# Patient Record
Sex: Male | Born: 1965 | Hispanic: No | Smoking: Former smoker
Health system: Southern US, Community
[De-identification: ages and names within clinical notes are randomized; demographics above are authoritative.]

## PROBLEM LIST (undated history)

## (undated) DIAGNOSIS — E785 Hyperlipidemia, unspecified: Secondary | ICD-10-CM

## (undated) DIAGNOSIS — F419 Anxiety disorder, unspecified: Secondary | ICD-10-CM

## (undated) DIAGNOSIS — G4700 Insomnia, unspecified: Secondary | ICD-10-CM

## (undated) DIAGNOSIS — F329 Major depressive disorder, single episode, unspecified: Secondary | ICD-10-CM

## (undated) DIAGNOSIS — F32A Depression, unspecified: Secondary | ICD-10-CM

## (undated) DIAGNOSIS — T7840XA Allergy, unspecified, initial encounter: Secondary | ICD-10-CM

## (undated) HISTORY — DX: Major depressive disorder, single episode, unspecified: F32.9

## (undated) HISTORY — DX: Hyperlipidemia, unspecified: E78.5

## (undated) HISTORY — DX: Insomnia, unspecified: G47.00

## (undated) HISTORY — DX: Anxiety disorder, unspecified: F41.9

## (undated) HISTORY — DX: Allergy, unspecified, initial encounter: T78.40XA

## (undated) HISTORY — PX: APPENDECTOMY: SHX54

## (undated) HISTORY — DX: Depression, unspecified: F32.A

---
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
## 2003-11-15 ENCOUNTER — Emergency Department (HOSPITAL_COMMUNITY): Admission: EM | Admit: 2003-11-15 | Discharge: 2003-11-15 | Payer: Self-pay | Admitting: Emergency Medicine

## 2004-02-20 ENCOUNTER — Ambulatory Visit: Payer: Self-pay | Admitting: Internal Medicine

## 2004-03-23 ENCOUNTER — Ambulatory Visit: Payer: Self-pay | Admitting: Internal Medicine

## 2005-02-05 ENCOUNTER — Ambulatory Visit: Payer: Self-pay | Admitting: Internal Medicine

## 2005-02-19 ENCOUNTER — Ambulatory Visit: Payer: Self-pay | Admitting: Internal Medicine

## 2005-03-22 ENCOUNTER — Ambulatory Visit: Payer: Self-pay | Admitting: Internal Medicine

## 2005-10-03 ENCOUNTER — Ambulatory Visit: Payer: Self-pay | Admitting: Internal Medicine

## 2005-11-01 ENCOUNTER — Ambulatory Visit: Payer: Self-pay | Admitting: Internal Medicine

## 2005-12-17 ENCOUNTER — Ambulatory Visit: Payer: Self-pay | Admitting: Internal Medicine

## 2006-04-15 ENCOUNTER — Ambulatory Visit: Payer: Self-pay | Admitting: Internal Medicine

## 2006-04-15 LAB — CONVERTED CEMR LAB
Cholesterol: 311 mg/dL (ref 0–200)
Direct LDL: 260.1 mg/dL
HDL: 39.2 mg/dL (ref >39.0)
Total CHOL/HDL Ratio: 7.9
Triglycerides: 48 mg/dL (ref 0–149)
VLDL: 10 mg/dL (ref 0–40)

## 2006-05-06 ENCOUNTER — Ambulatory Visit: Payer: Self-pay | Admitting: Internal Medicine

## 2006-06-19 ENCOUNTER — Encounter: Payer: Self-pay | Admitting: Internal Medicine

## 2006-06-19 ENCOUNTER — Ambulatory Visit: Payer: Self-pay | Admitting: Internal Medicine

## 2006-06-19 DIAGNOSIS — E785 Hyperlipidemia, unspecified: Secondary | ICD-10-CM | POA: Insufficient documentation

## 2006-06-19 DIAGNOSIS — F341 Dysthymic disorder: Secondary | ICD-10-CM | POA: Insufficient documentation

## 2006-06-19 DIAGNOSIS — G4700 Insomnia, unspecified: Secondary | ICD-10-CM | POA: Insufficient documentation

## 2006-10-06 ENCOUNTER — Telehealth: Payer: Self-pay | Admitting: Internal Medicine

## 2006-10-10 ENCOUNTER — Ambulatory Visit: Payer: Self-pay | Admitting: Internal Medicine

## 2006-10-10 LAB — CONVERTED CEMR LAB

## 2006-10-11 LAB — CONVERTED CEMR LAB
ALT: 18 units/L (ref 0–53)
AST: 18 units/L (ref 0–37)
Cholesterol: 280 mg/dL (ref 0–200)
Direct LDL: 237.4 mg/dL
HDL: 37.6 mg/dL — ABNORMAL LOW (ref >39.0)
TSH: 0.78 microintl units/mL (ref 0.35–5.50)
Total CHOL/HDL Ratio: 7.4
Triglycerides: 69 mg/dL (ref 0–149)
VLDL: 14 mg/dL (ref 0–40)

## 2006-10-13 ENCOUNTER — Encounter (INDEPENDENT_AMBULATORY_CARE_PROVIDER_SITE_OTHER): Payer: Self-pay | Admitting: *Deleted

## 2007-02-06 ENCOUNTER — Ambulatory Visit: Payer: Self-pay | Admitting: Internal Medicine

## 2007-03-16 ENCOUNTER — Telehealth (INDEPENDENT_AMBULATORY_CARE_PROVIDER_SITE_OTHER): Payer: Self-pay | Admitting: *Deleted

## 2007-05-04 ENCOUNTER — Ambulatory Visit: Payer: Self-pay | Admitting: Internal Medicine

## 2007-05-07 ENCOUNTER — Ambulatory Visit: Payer: Self-pay | Admitting: Internal Medicine

## 2007-05-13 ENCOUNTER — Telehealth: Payer: Self-pay | Admitting: Internal Medicine

## 2007-05-13 LAB — CONVERTED CEMR LAB
ALT: 21 units/L (ref 0–53)
AST: 23 units/L (ref 0–37)
Calcium: 9.5 mg/dL (ref 8.4–10.5)
Chloride: 105 meq/L (ref 96–112)
Eosinophils Absolute: 0.1 10*3/uL (ref 0.0–0.6)
GFR calc non Af Amer: 88 mL/min
Glucose, Bld: 98 mg/dL (ref 70–99)
HDL: 41.1 mg/dL (ref >39.0)
Lymphocytes Relative: 25 % (ref 12.0–46.0)
MCV: 87.9 fL (ref 78.0–100.0)
Monocytes Relative: 7.9 % (ref 3.0–11.0)
Neutro Abs: 2.9 10*3/uL (ref 1.4–7.7)
Potassium: 4.2 meq/L (ref 3.5–5.1)
RBC: 4.79 M/uL (ref 4.22–5.81)
RDW: 11.3 % — ABNORMAL LOW (ref 11.5–14.6)
Sodium: 145 meq/L (ref 135–145)
Total CHOL/HDL Ratio: 5.7
Triglycerides: 74 mg/dL (ref 0–149)

## 2007-10-07 ENCOUNTER — Ambulatory Visit: Payer: Self-pay | Admitting: Internal Medicine

## 2008-01-13 ENCOUNTER — Ambulatory Visit: Payer: Self-pay | Admitting: Internal Medicine

## 2008-01-14 LAB — CONVERTED CEMR LAB
ALT: 43 units/L (ref 0–53)
AST: 29 units/L (ref 0–37)
Cholesterol: 208 mg/dL (ref 0–200)
Direct LDL: 154.6 mg/dL
HDL: 27.6 mg/dL — ABNORMAL LOW (ref >39.0)
Total CHOL/HDL Ratio: 7.5
Triglycerides: 85 mg/dL (ref 0–149)
VLDL: 17 mg/dL (ref 0–40)

## 2008-07-19 ENCOUNTER — Telehealth (INDEPENDENT_AMBULATORY_CARE_PROVIDER_SITE_OTHER): Payer: Self-pay | Admitting: *Deleted

## 2008-07-20 ENCOUNTER — Ambulatory Visit: Payer: Self-pay | Admitting: Internal Medicine

## 2008-11-09 ENCOUNTER — Ambulatory Visit: Payer: Self-pay | Admitting: Internal Medicine

## 2008-11-11 ENCOUNTER — Telehealth (INDEPENDENT_AMBULATORY_CARE_PROVIDER_SITE_OTHER): Payer: Self-pay | Admitting: *Deleted

## 2008-11-11 LAB — CONVERTED CEMR LAB
ALT: 30 units/L (ref 0–53)
AST: 27 units/L (ref 0–37)
BUN: 16 mg/dL (ref 6–23)
CO2: 33 meq/L — ABNORMAL HIGH (ref 19–32)
Calcium: 9.3 mg/dL (ref 8.4–10.5)
Chloride: 108 meq/L (ref 96–112)
Cholesterol: 238 mg/dL — ABNORMAL HIGH (ref 0–200)
Creatinine, Ser: 1 mg/dL (ref 0.4–1.5)
Direct LDL: 198.5 mg/dL
GFR calc non Af Amer: 87.53 mL/min (ref >60)
Glucose, Bld: 97 mg/dL (ref 70–99)
HDL: 41.4 mg/dL (ref >39.00)
Hemoglobin: 14.8 g/dL (ref 13.0–17.0)
Potassium: 4.7 meq/L (ref 3.5–5.1)
Sodium: 146 meq/L — ABNORMAL HIGH (ref 135–145)
Total CHOL/HDL Ratio: 6
Triglycerides: 78 mg/dL (ref 0.0–149.0)
VLDL: 15.6 mg/dL (ref 0.0–40.0)

## 2009-01-17 ENCOUNTER — Telehealth (INDEPENDENT_AMBULATORY_CARE_PROVIDER_SITE_OTHER): Payer: Self-pay | Admitting: *Deleted

## 2009-02-01 ENCOUNTER — Telehealth (INDEPENDENT_AMBULATORY_CARE_PROVIDER_SITE_OTHER): Payer: Self-pay | Admitting: *Deleted

## 2009-05-10 ENCOUNTER — Ambulatory Visit: Payer: Self-pay | Admitting: Internal Medicine

## 2009-06-21 ENCOUNTER — Ambulatory Visit: Payer: Self-pay | Admitting: Internal Medicine

## 2009-06-24 LAB — CONVERTED CEMR LAB
ALT: 34 units/L (ref 0–53)
Direct LDL: 219.5 mg/dL
VLDL: 22.2 mg/dL (ref 0.0–40.0)

## 2009-07-17 ENCOUNTER — Telehealth (INDEPENDENT_AMBULATORY_CARE_PROVIDER_SITE_OTHER): Payer: Self-pay | Admitting: *Deleted

## 2009-10-10 ENCOUNTER — Ambulatory Visit: Payer: Self-pay | Admitting: Internal Medicine

## 2009-10-25 LAB — CONVERTED CEMR LAB
BUN: 12 mg/dL (ref 6–23)
Calcium: 9.6 mg/dL (ref 8.4–10.5)
Chloride: 109 meq/L (ref 96–112)
Cholesterol: 226 mg/dL — ABNORMAL HIGH (ref 0–200)
Creatinine, Ser: 0.9 mg/dL (ref 0.4–1.5)
Direct LDL: 164.1 mg/dL
Glucose, Bld: 91 mg/dL (ref 70–99)
Potassium: 4.8 meq/L (ref 3.5–5.1)
Sodium: 143 meq/L (ref 135–145)
Triglycerides: 74 mg/dL (ref 0.0–149.0)
VLDL: 14.8 mg/dL (ref 0.0–40.0)

## 2010-02-06 ENCOUNTER — Telehealth: Payer: Self-pay | Admitting: Internal Medicine

## 2010-04-10 ENCOUNTER — Telehealth: Payer: Self-pay | Admitting: Internal Medicine

## 2010-04-11 ENCOUNTER — Ambulatory Visit
Admission: RE | Admit: 2010-04-11 | Discharge: 2010-04-11 | Payer: Self-pay | Source: Home / Self Care | Attending: Internal Medicine | Admitting: Internal Medicine

## 2010-04-25 NOTE — Assessment & Plan Note (Signed)
Summary: 6 MTH FU/NS/KDC   Vital Signs:  Patient profile:   45 year old male Height:      73 inches Weight:      204.6 pounds BMI:     27.09 Pulse rate:   74 / minute BP sitting:   122 / 80 CC: rov Is Patient Diabetic? No   History of Present Illness: bump in the suprapubic area, likes a "blood test"  high chol-- likes FLP, diet not better  insomnia-- switched from xanax to [place] not working as well anymore  back on xanax?  Current Medications (verified): 1)  Ambien 10 Mg Tabs (Zolpidem Tartrate) .Marland Kitchen.. 1 By Mouth At Bedtime As Needed 2)  Fluoxetine Hcl 20 Mg  Caps (Fluoxetine Hcl) .... 3 By Mouth Once Daily 3)  Pravastatin Sodium 80 Mg  Tabs (Pravastatin Sodium) .Marland Kitchen.. 1 By Mouth Once Daily  Allergies (verified): No Known Drug Allergies  Comments:  Nurse/Medical Assistant: The patient's medications were reviewed with the patient and were updated in the Medication List.  Past History:  Past Medical History: Reviewed history from 07/20/2008 and no changes required. DYSLIPIDEMIA   ANXIETY DEPRESSION  INSOMNIA        Social History: divorce lives w/ his 2 children tobacco-- quit ETOH-- socially  Review of Systems       denies penile discharge or dysuria has a single sexual partner, does not use a condom  Physical Exam  General:  alert and well-developed.   Skin:  has a 1 mm pink/vascular spot in the left suprapubic area   Impression & Recommendations:  Problem # 1:  INSOMNIA (ICD-780.52) history of insomnia since he got divorced years ago Xanax was helping him fall asleep but he still woke up on off ambien  not working anymore after a short trial plan switch to extended release Xanax see how that works    Problem # 2:  cherry spot  skin lesion that is consistent with a cherry spot no evidence of STD which was the patient's concern patient to let me know if a skin lesion change in any way  Problem # 3:  DYSLIPIDEMIA  (ICD-272.4)  encourage diet His updated medication list for this problem includes:    Pravastatin Sodium 80 Mg Tabs (Pravastatin sodium) .Marland Kitchen... 1 by mouth once daily    Labs Reviewed: SGOT: 27 (11/09/2008)   SGPT: 30 (11/09/2008)   HDL:41.40 (11/09/2008), 27.6 (01/13/2008)  LDL:DEL (01/13/2008), DEL (05/07/2007)  Chol:238 (11/09/2008), 208 (01/13/2008)  Trig:78.0 (11/09/2008), 85 (01/13/2008)  Orders: Venipuncture (16109) TLB-ALT (SGPT) (84460-ALT) TLB-AST (SGOT) (84450-SGOT) TLB-Lipid Panel (80061-LIPID)  Complete Medication List: 1)  Alprazolam Xr 0.5 Mg Xr24h-tab (Alprazolam) .... One or two by mouth at bedtime p.r.n. insomnia 2)  Fluoxetine Hcl 20 Mg Caps (Fluoxetine hcl) .... 3 by mouth once daily 3)  Pravastatin Sodium 80 Mg Tabs (Pravastatin sodium) .Marland Kitchen.. 1 by mouth once daily  Patient Instructions: 1)  Please schedule a follow-up appointment in 4 to 6 months .  Prescriptions: ALPRAZOLAM XR 0.5 MG XR24H-TAB (ALPRAZOLAM) one or two by mouth at bedtime p.r.n. insomnia  #60 x 0   Entered and Authorized by:   Nolon Rod. Rakeem Colley MD   Signed by:   Nolon Rod. Kaydee Magel MD on 05/10/2009   Method used:   Print then Give to Patient   RxID:   [id number]

## 2010-04-25 NOTE — Progress Notes (Signed)
Summary: refill  Phone Note Refill Request Message from:  Fax from Pharmacy  Refills Requested: Medication #1:  FLUOXETINE HCL 20 MG  CAPS 3 by mouth once daily  Medication #2:  PRAVASTATIN SODIUM 80 MG  TABS 1 by mouth once daily. patient will pick up 161096  Initial call taken by: Okey Regal [place],  February 06, 2010 1:17 PM  Follow-up for Phone Call        Patient is coming due for cpx, how many refills do you prefer? Follow-up by: Lucious Groves CMA,  February 06, 2010 2:56 PM  Additional Follow-up for Phone Call Additional follow up Details #1::        6 months  Additional Follow-up by: [facility] E. Paz MD,  February 07, 2010 8:02 AM    Prescriptions: PRAVASTATIN SODIUM 80 MG  TABS (PRAVASTATIN SODIUM) 1 by mouth once daily  #30 x 6   Entered by:   Lucious Groves CMA   Authorized by:   Nolon Rod. Paz MD   Signed by:   Lucious Groves CMA on 02/07/2010   Method used:   Print then Give to Patient   RxID:   [id number] FLUOXETINE HCL 20 MG  CAPS (FLUOXETINE HCL) 3 by mouth once daily  #90 Each x 6   Entered by:   Lucious Groves CMA   Authorized by:   Nolon Rod. Paz MD   Signed by:   Lucious Groves CMA on 02/07/2010   Method used:   Print then Give to Patient   RxID:   [id number]

## 2010-04-25 NOTE — Progress Notes (Signed)
Summary: Refill Request  Phone Note Refill Request Call back at [phone] Message from:  Pharmacy on July 17, 2009 10:52 AM  Refills Requested: Medication #1:  PRAVASTATIN SODIUM 80 MG  TABS 1 by mouth once daily.   Dosage confirmed as above?Dosage Confirmed   Supply Requested: 1 month   Last Refilled: 01/16/2009 [facility] on [address]  Next Appointment Scheduled: 7.19.11 Initial call taken by: Harold Barban,  July 17, 2009 10:52 AM    Prescriptions: PRAVASTATIN SODIUM 80 MG  TABS (PRAVASTATIN SODIUM) 1 by mouth once daily  #30 x 6   Entered by:   Shary Decamp   Authorized by:   Nolon Rod. Paz MD   Signed by:   Shary Decamp on 07/17/2009   Method used:   Electronically to        [facility] [address] (retail)       [phone]       [place], [place]  [address]       Ph: [phone]       Fax: [phone]   RxID:   [id number]

## 2010-04-25 NOTE — Assessment & Plan Note (Signed)
Summary: 6 mth fu/kdc   Vital Signs:  Patient profile:   45 year old male Weight:      194.38 pounds Pulse rate:   72 / minute Pulse rhythm:   regular BP sitting:   116 / 80  (left arm) Cuff size:   large  Vitals Entered By: Army Fossa CMA (October 10, 2009 8:47 AM) CC: 6 month f/u: fasting   History of Present Illness: ROV feels well has lost wt, thinks d/t better diet and being very active at work, works long hours    ROS sleeps at baseline , not perfect, still wakes up frecuently  anxiety well controlled  good medication compliance w/ cholestrrol meds  no CP-SOB no N-V no myalgias other than aches and pains from work    Current Medications (verified): 1)  Alprazolam Xr 0.5 Mg Xr24h-Tab (Alprazolam) .... One or Two By Mouth At Bedtime P.r.n. Insomnia 2)  Fluoxetine Hcl 20 Mg  Caps (Fluoxetine Hcl) .... 3 By Mouth Once Daily 3)  Pravastatin Sodium 80 Mg  Tabs (Pravastatin Sodium) .Marland Kitchen.. 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   Lungs:  normal respiratory effort, no intercostal retractions, no accessory muscle use, and normal breath sounds.   Heart:  normal rate, regular rhythm, and no murmur.   Extremities:  no pretibial edema bilaterally    Impression & Recommendations:  Problem # 1:  DYSLIPIDEMIA (ICD-272.4)  good compliance with medications since his last office visit Labs praised  for his better diet His updated medication list for this problem includes:    Pravastatin Sodium 80 Mg Tabs (Pravastatin sodium) .Marland Kitchen... 1 by mouth once  daily  Labs Reviewed: SGOT: 30 (06/21/2009)   SGPT: 34 (06/21/2009)   HDL:41.30 (06/21/2009), 41.40 (11/09/2008)  LDL:DEL (01/13/2008), DEL (05/07/2007)  Chol:264 (06/21/2009), 238 (11/09/2008)  Trig:111.0 (06/21/2009), 78.0 (11/09/2008)  Orders: Venipuncture (16109) TLB-Lipid Panel (80061-LIPID) TLB-ALT (SGPT) (84460-ALT) TLB-AST (SGOT) (84450-SGOT) TLB-BMP (Basic Metabolic Panel-BMET) (80048-METABOL) Specimen Handling (60454)  Problem # 2:  ANXIETY DEPRESSION (ICD-300.4) Well controlled   Complete Medication List: 1)  Alprazolam Xr 0.5 Mg Xr24h-tab (Alprazolam) .... One or two by mouth at bedtime p.r.n. insomnia 2)  Fluoxetine Hcl 20 Mg Caps (Fluoxetine hcl) .... 3 by mouth once daily 3)  Pravastatin Sodium 80 Mg Tabs (Pravastatin sodium) .Marland Kitchen.. 1 by mouth once daily  Patient Instructions: 1)  Please schedule a follow-up appointment in 6 to 8 months , fasting, physical exam

## 2010-04-26 NOTE — Assessment & Plan Note (Signed)
Summary: DISCUSS SLEEP MED FOR NIGHTLY USE/KB   Vital Signs:  Patient profile:   45 year old male Height:      73 inches (185.42 cm) Weight:      199.13 pounds (90.51 kg) BMI:     26.37 Temp:     98.4 degrees F (36.89 degrees C) oral BP sitting:   140 / 80  (left arm)  Vitals Entered By: Lucious Groves CMA (April 11, 2010 11:47 AM) CC: Discuss sleep med for nightly use./kb Is Patient Diabetic? No Pain Assessment Patient in pain? no        History of Present Illness: CC --xanax RF He usually takes one and occasionally two Xanax at night with good results   Current Medications (verified): 1)  Alprazolam Xr 0.5 Mg Xr24h-Tab (Alprazolam) .... One or Two By Mouth At Bedtime P.r.n. Insomnia 2)  Fluoxetine Hcl 20 Mg  Caps (Fluoxetine Hcl) .... 3 By Mouth Once Daily 3)  Pravastatin Sodium 80 Mg  Tabs (Pravastatin Sodium) .Marland Kitchen.. 1 By Mouth Once Daily  Allergies (verified): No Known Drug Allergies  Past History:  Past Medical History: Reviewed history from 10/10/2009 and no changes required. DYSLIPIDEMIA   ANXIETY DEPRESSION  INSOMNIA        Past Surgical History: Reviewed history from 10/10/2009 and no changes required. Appendectomy age 47  Social History: Reviewed history from 10/10/2009 and no changes required. 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  Physical Exam  General:  alert, well-developed, and well-nourished.   Psych:  not anxious appearing and not depressed appearing.     Impression & Recommendations:  Problem # 1:  INSOMNIA (ICD-780.52) okay to refill Xanax. This is an early refill because he switched pharmacies.  Problem # 2:  DYSLIPIDEMIA (ICD-272.4) good compliance with medicine, all previous FLPs discussed with patient. His updated medication list for this problem includes:  Pravastatin Sodium 80 Mg Tabs (Pravastatin sodium) .Marland Kitchen... 1 by mouth once daily  Labs Reviewed: SGOT: 20 (10/10/2009)   SGPT: 17 (10/10/2009)   HDL:46.00 (10/10/2009), 41.30 (06/21/2009)  LDL:DEL (01/13/2008), DEL (05/07/2007)  Chol:226 (10/10/2009), 264 (06/21/2009)  Trig:74.0 (10/10/2009), 111.0 (06/21/2009)  Complete Medication List: 1)  Alprazolam Xr 0.5 Mg Xr24h-tab (Alprazolam) .... One or two by mouth at bedtime p.r.n. insomnia 2)  Fluoxetine Hcl 20 Mg Caps (Fluoxetine hcl) .... 3 by mouth once daily 3)  Pravastatin Sodium 80 Mg Tabs (Pravastatin sodium) .Marland Kitchen.. 1 by mouth once daily  Patient Instructions: 1)  Please schedule a follow-up appointment in 4 months . Fasting , physical Prescriptions: ALPRAZOLAM XR 0.5 MG XR24H-TAB (ALPRAZOLAM) one or two by mouth at bedtime p.r.n. insomnia  #60 x 3   Entered and Authorized by:   Nolon Rod. Varie Machamer MD   Signed by:   Nolon Rod. Keandrea Tapley MD on 04/11/2010   Method used:   Print then Give to Patient   RxID:   [id number]    Orders Added: 1)  Est. Patient Level II [14782]

## 2010-04-26 NOTE — Progress Notes (Signed)
Summary: Rx Refill Request  Phone Note Refill Request Call back at [phone] Message from:  Fax from Pharmacy on April 10, 2010 8:12 AM  Refills Requested: Medication #1:  ALPRAZOLAM XR 0.5 MG XR24H-TAB one or two by mouth at bedtime p.r.n. insomnia   Notes: PRN for Ins[facility] Fax# [phone]   Method Requested: Fax to Local Pharmacy Next Appointment Scheduled: Feb. 21, 2012 Initial call taken by: Barnie Mort,  April 10, 2010 8:15 AM  Follow-up for Phone Call        last refilled 01/31/10 x 3. no refills on file. Has appt in Feb.  Follow-up by: Army Fossa CMA,  April 10, 2010 8:22 AM  Additional Follow-up for Phone Call Additional follow up Details #1::        3 weeks to early , see last Rx Michael Berg E. Michael Mclees MD  April 10, 2010 3:10 PM   Patient notified of the above and states that he no longer uses [address] on [address]. Patient notes that he hasnt had Alprazolam in awhile and is almost out. I advised the patient that he would have to come in and discuss prescription for nightly use. He will come tomorrow. Lucious Groves CMA  April 10, 2010 3:41 PM

## 2010-08-07 ENCOUNTER — Encounter: Payer: Self-pay | Admitting: Internal Medicine

## 2010-08-08 ENCOUNTER — Encounter: Payer: Self-pay | Admitting: Internal Medicine

## 2010-10-15 ENCOUNTER — Other Ambulatory Visit: Payer: Self-pay | Admitting: Internal Medicine

## 2010-10-15 MED ORDER — ALPRAZOLAM ER 0.5 MG PO TB24: ORAL_TABLET | ORAL | Status: DC

## 2010-10-15 NOTE — Telephone Encounter (Signed)
#  60, no RF Tell pt : no further RF w/o OV

## 2010-10-22 ENCOUNTER — Other Ambulatory Visit: Payer: Self-pay | Admitting: Internal Medicine

## 2010-11-19 ENCOUNTER — Other Ambulatory Visit: Payer: Self-pay | Admitting: Internal Medicine

## 2010-11-19 MED ORDER — ALPRAZOLAM ER 0.5 MG PO TB24: ORAL_TABLET | ORAL | Status: DC

## 2010-11-19 NOTE — Telephone Encounter (Signed)
Last filled 10/15/10 and last office visit 04/11/10

## 2010-11-28 ENCOUNTER — Other Ambulatory Visit: Payer: Self-pay | Admitting: Internal Medicine

## 2010-12-22 ENCOUNTER — Other Ambulatory Visit: Payer: Self-pay | Admitting: Internal Medicine

## 2010-12-24 NOTE — Telephone Encounter (Signed)
Pt due for appt.  RX denied.

## 2011-01-01 ENCOUNTER — Other Ambulatory Visit: Payer: Self-pay | Admitting: Internal Medicine

## 2011-01-01 NOTE — Telephone Encounter (Signed)
Refill prozac & pravastatin - [facility] - patient has an appt 191478 for cpx needs refill until then

## 2011-01-02 MED ORDER — PRAVASTATIN SODIUM 80 MG PO TABS: 80.0000 mg | ORAL_TABLET | Freq: Every day | ORAL | Status: DC

## 2011-01-02 NOTE — Telephone Encounter (Signed)
Fluoxetine request [last refill #90x6 02/06/10 Last OV 04/11/10]

## 2011-01-10 ENCOUNTER — Encounter: Payer: Self-pay | Admitting: Internal Medicine

## 2011-01-11 ENCOUNTER — Ambulatory Visit (INDEPENDENT_AMBULATORY_CARE_PROVIDER_SITE_OTHER): Payer: BC Managed Care – PPO | Admitting: Internal Medicine

## 2011-01-11 ENCOUNTER — Encounter: Payer: Self-pay | Admitting: Internal Medicine

## 2011-01-11 DIAGNOSIS — Z23 Encounter for immunization: Secondary | ICD-10-CM

## 2011-01-11 DIAGNOSIS — Z0000 Encounter for general adult medical examination without abnormal findings: Secondary | ICD-10-CM | POA: Insufficient documentation

## 2011-01-11 LAB — CBC WITH DIFFERENTIAL/PLATELET
MCHC: 33.7 g/dL (ref 30.0–36.0)
Neutro Abs: 2.6 10*3/uL (ref 1.4–7.7)
Neutrophils Relative %: 63 % (ref 43.0–77.0)
RBC: 4.98 Mil/uL (ref 4.22–5.81)
RDW: 12.9 % (ref 11.5–14.6)

## 2011-01-11 LAB — COMPREHENSIVE METABOLIC PANEL
Albumin: 4.5 g/dL (ref 3.5–5.2)
Alkaline Phosphatase: 47 U/L (ref 39–117)
BUN: 11 mg/dL (ref 6–23)
CO2: 32 mEq/L (ref 19–32)
Chloride: 104 mEq/L (ref 96–112)
Glucose, Bld: 89 mg/dL (ref 70–99)
Potassium: 4.1 mEq/L (ref 3.5–5.1)
Sodium: 145 mEq/L (ref 135–145)
Total Bilirubin: 0.9 mg/dL (ref 0.3–1.2)
Total Protein: 7.8 g/dL (ref 6.0–8.3)

## 2011-01-11 LAB — LIPID PANEL
HDL: 44.4 mg/dL (ref >39.00)
Total CHOL/HDL Ratio: 5
Triglycerides: 48 mg/dL (ref 0.0–149.0)
VLDL: 9.6 mg/dL (ref 0.0–40.0)

## 2011-01-11 LAB — TSH: TSH: 0.89 u[IU]/mL (ref 0.35–5.50)

## 2011-01-11 LAB — LDL CHOLESTEROL, DIRECT: Direct LDL: 183 mg/dL

## 2011-01-11 NOTE — Progress Notes (Signed)
  Subjective:    Patient ID: Michael Berg, male    DOB: [birth year], 45 y.o.   MRN: [id number]  HPI Complete physical exam Good compliance with cholesterol medication Insomnia relatively well-controlled with one or 2 Xanax a day. He has a history of anxiety depression, anxiety has always been his biggest issue, he remains extremity busy  due to his job. Takes fluoxetine 1, 2 or   3 tablets a day "as needed" depending on the kind  of day he is having.  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 Coronary artery disease Father     CABG at age 55  . Colon cancer      uncle dx at age 77 (?)  . Diabetes      GM  . Prostate cancer Neg Hx      Review of Systems  Respiratory: Negative for cough, shortness of breath and wheezing.   Cardiovascular: Negative for chest pain and leg swelling.  Gastrointestinal: Negative for abdominal pain and blood in stool.  Genitourinary: Negative for hematuria and difficulty urinating.  Psychiatric/Behavioral:       No depression       Objective:   Physical Exam  Constitutional: He is oriented to person, place, and time. He appears well-developed and well-nourished.  HENT:  Head: Normocephalic and atraumatic.  Neck: No thyromegaly present.  Cardiovascular: Normal rate, regular rhythm and normal heart sounds.   No murmur heard. Pulmonary/Chest: Effort normal and breath sounds normal. No respiratory distress. He has no wheezes. He has no rales.  Abdominal: Soft. Bowel sounds are normal. He exhibits no distension. There is no tenderness. There is no rebound and no guarding.  Musculoskeletal: He exhibits no edema.  Neurological: He is alert and  oriented to person, place, and time.  Skin: Skin is warm and dry. He is not diaphoretic.  Psychiatric: He has a normal mood and affect. His behavior is normal. Judgment and thought content normal.          Assessment & Plan:

## 2011-01-11 NOTE — Assessment & Plan Note (Signed)
Takes fluoxetine 1,2 or 3 tabs depending on stress level rec to stay on one dose, elected 2 tabs a day reasses in 6 months Also rec to slow down if possible, states is extremely busy

## 2011-01-11 NOTE — Assessment & Plan Note (Addendum)
Td today rec flu shot, will think about it  Tobacco-- counseled Discussed diet-exercise -STE

## 2011-01-12 LAB — RPR

## 2011-01-12 LAB — HIV ANTIBODY (ROUTINE TESTING W REFLEX): HIV: NONREACTIVE

## 2011-01-18 ENCOUNTER — Telehealth: Payer: Self-pay | Admitting: *Deleted

## 2011-01-18 DIAGNOSIS — E785 Hyperlipidemia, unspecified: Secondary | ICD-10-CM

## 2011-01-18 MED ORDER — ATORVASTATIN CALCIUM 80 MG PO TABS: 80.0000 mg | ORAL_TABLET | Freq: Every day | ORAL | Status: DC

## 2011-01-18 NOTE — Telephone Encounter (Signed)
LMOM to inform Pt lab results w/contact name & number for call back to schedule 6-wk lab only appt. New Rx to pharmacy. New lab orders placed in Epic. Mailed results.

## 2011-02-27 ENCOUNTER — Other Ambulatory Visit: Payer: Self-pay | Admitting: Internal Medicine

## 2011-03-06 ENCOUNTER — Other Ambulatory Visit: Payer: BC Managed Care – PPO

## 2011-03-11 ENCOUNTER — Other Ambulatory Visit: Payer: BC Managed Care – PPO

## 2011-04-03 ENCOUNTER — Other Ambulatory Visit: Payer: Self-pay | Admitting: Internal Medicine

## 2011-04-03 DIAGNOSIS — E785 Hyperlipidemia, unspecified: Secondary | ICD-10-CM

## 2011-04-03 DIAGNOSIS — T887XXA Unspecified adverse effect of drug or medicament, initial encounter: Secondary | ICD-10-CM

## 2011-04-04 ENCOUNTER — Other Ambulatory Visit (INDEPENDENT_AMBULATORY_CARE_PROVIDER_SITE_OTHER): Payer: BC Managed Care – PPO

## 2011-04-04 DIAGNOSIS — E785 Hyperlipidemia, unspecified: Secondary | ICD-10-CM

## 2011-04-04 DIAGNOSIS — R7989 Other specified abnormal findings of blood chemistry: Secondary | ICD-10-CM

## 2011-04-04 LAB — LIPID PANEL
HDL: 39.4 mg/dL (ref >39.00)
Total CHOL/HDL Ratio: 4
VLDL: 11 mg/dL (ref 0.0–40.0)

## 2011-04-04 LAB — AST: AST: 29 U/L (ref 0–37)

## 2011-04-05 ENCOUNTER — Other Ambulatory Visit: Payer: Self-pay | Admitting: Internal Medicine

## 2011-04-05 MED ORDER — ALPRAZOLAM ER 0.5 MG PO TB24: ORAL_TABLET | ORAL | Status: DC

## 2011-04-05 NOTE — Telephone Encounter (Signed)
Please advise Xanax refill?  

## 2011-04-05 NOTE — Telephone Encounter (Signed)
Ok  60, #3

## 2011-04-09 MED ORDER — ATORVASTATIN CALCIUM 80 MG PO TABS: 80.0000 mg | ORAL_TABLET | Freq: Every day | ORAL | Status: DC

## 2011-07-17 ENCOUNTER — Ambulatory Visit: Payer: BC Managed Care – PPO | Admitting: Internal Medicine

## 2011-07-17 ENCOUNTER — Ambulatory Visit (INDEPENDENT_AMBULATORY_CARE_PROVIDER_SITE_OTHER): Payer: BC Managed Care – PPO | Admitting: Internal Medicine

## 2011-07-17 ENCOUNTER — Encounter: Payer: Self-pay | Admitting: Internal Medicine

## 2011-07-17 VITALS — BP 124/82 | HR 76 | Temp 97.9°F | Wt 210.0 lb

## 2011-07-17 DIAGNOSIS — E785 Hyperlipidemia, unspecified: Secondary | ICD-10-CM

## 2011-07-17 DIAGNOSIS — F341 Dysthymic disorder: Secondary | ICD-10-CM

## 2011-07-17 NOTE — Progress Notes (Signed)
  Subjective:    Patient ID: Michael Berg, male    DOB: [birth year], 46 y.o.   MRN: [id number]  HPI Routine office visit Patient is taking Lipitor since 12-2010, complains of bilateral shoulder pain and left hand pain for the last 3 months, symptoms are gradual in getting slightly worse.  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 Denies actual myalgias He has some mild allergies consistent of nasal congestion without fever. Good compliance with Prozac, 2 tablets a day. Good symptom control as well.     Objective:   Physical Exam  General -- alert, well-developed, and overweight appearing. No apparent distress.  Lungs -- normal respiratory effort, no intercostal retractions, no accessory muscle use, and normal breath sounds.   Heart-- normal rate, regular rhythm, no murmur, and no gallop.   Extremities-- no hand or wrist synovitis on exam Neurologic-- alert & oriented X3 and strength normal in all extremities. Psych-- Cognition and judgment appear intact. Alert and cooperative with normal attention span and concentration.  not anxious appearing and not depressed appearing.         Assessment & Plan:

## 2011-07-17 NOTE — Patient Instructions (Addendum)
Hold Lipitor for 4 weeks then go back to it. Let me know how this  work, if Lipitor is truly causing the pain, we'll have to switch to another cholesterol medication. When you go back to Lipitor, take along with CoQ10

## 2011-07-17 NOTE — Assessment & Plan Note (Signed)
Well-controlled, no change 

## 2011-07-17 NOTE — Assessment & Plan Note (Addendum)
46 year old gentleman with a family history of heart disease and a long history of high cholesterol. His LDL without treatment has been as high as 260. LDL goal is less than 130. In October 2012, his cholesterol was not well controlled w/ pravachol, he was switched to Lipitor 80 mg with excellent response however he has developed arthralgias. See history of present illness. Plan: Hold temporarily Lipitor, restart with CoQ10. See instructions. If Lipitor is truly causing arthralgias we'll have to switch to possibly Crestor or livalo

## 2011-07-22 ENCOUNTER — Other Ambulatory Visit: Payer: Self-pay | Admitting: Internal Medicine

## 2011-07-22 NOTE — Telephone Encounter (Signed)
Refill done.  

## 2011-08-27 ENCOUNTER — Other Ambulatory Visit: Payer: Self-pay | Admitting: Internal Medicine

## 2011-08-27 MED ORDER — ALPRAZOLAM ER 0.5 MG PO TB24: ORAL_TABLET | ORAL | Status: DC

## 2011-08-27 NOTE — Telephone Encounter (Signed)
Ok to refill? #60 with 3 refills. Last filled on 1.11.13.

## 2011-08-27 NOTE — Telephone Encounter (Signed)
Refill done.  

## 2011-08-27 NOTE — Telephone Encounter (Signed)
refill alprazolam 0.5mg  er tab acta Last wrt 1.11.13, qty 60 instructions stated  1 or 2 by mouth at bedtime prn for insomnia. NO ADDITIONAL REFILLS WITHOUT APPOINTMENT Last OV 4.24.13

## 2011-08-27 NOTE — Telephone Encounter (Signed)
60, 4 RF 

## 2011-11-05 ENCOUNTER — Other Ambulatory Visit: Payer: Self-pay | Admitting: Internal Medicine

## 2011-11-05 MED ORDER — FLUOXETINE HCL 20 MG PO CAPS: 40.0000 mg | ORAL_CAPSULE | Freq: Every day | ORAL | Status: DC

## 2011-11-05 NOTE — Telephone Encounter (Signed)
Noted pt upcoming apt on 01-16-12 and pt has refill on xanax left per noted 6-13 #60 with 4 refills, thus sent pt prozac #60 with 2 refills to last pt til upcoming OV per verbal from MD Tabori in absence of MD [facility]

## 2011-11-05 NOTE — Telephone Encounter (Signed)
[facility] him that they didn't not have a prescription for anxiety for him. He was told to call us. He would like it refilled, thanks amy

## 2012-01-16 ENCOUNTER — Encounter: Payer: BC Managed Care – PPO | Admitting: Internal Medicine

## 2012-04-03 ENCOUNTER — Other Ambulatory Visit: Payer: Self-pay | Admitting: Internal Medicine

## 2012-04-03 NOTE — Telephone Encounter (Signed)
Refill done.  

## 2012-07-15 ENCOUNTER — Encounter: Payer: Self-pay | Admitting: Lab

## 2012-07-16 ENCOUNTER — Ambulatory Visit (INDEPENDENT_AMBULATORY_CARE_PROVIDER_SITE_OTHER): Payer: BC Managed Care – PPO | Admitting: Internal Medicine

## 2012-07-16 ENCOUNTER — Encounter: Payer: Self-pay | Admitting: Internal Medicine

## 2012-07-16 ENCOUNTER — Encounter: Payer: Self-pay | Admitting: Lab

## 2012-07-16 VITALS — BP 136/84 | HR 75 | Temp 98.1°F | Wt 220.0 lb

## 2012-07-16 DIAGNOSIS — E785 Hyperlipidemia, unspecified: Secondary | ICD-10-CM

## 2012-07-16 DIAGNOSIS — F341 Dysthymic disorder: Secondary | ICD-10-CM

## 2012-07-16 DIAGNOSIS — M25562 Pain in left knee: Secondary | ICD-10-CM | POA: Insufficient documentation

## 2012-07-16 DIAGNOSIS — M25569 Pain in unspecified knee: Secondary | ICD-10-CM

## 2012-07-16 LAB — BASIC METABOLIC PANEL
Calcium: 9.2 mg/dL (ref 8.4–10.5)
GFR: 92.38 mL/min (ref >60.00)
Glucose, Bld: 97 mg/dL (ref 70–99)

## 2012-07-16 LAB — LIPID PANEL
Total CHOL/HDL Ratio: 8
Triglycerides: 87 mg/dL (ref 0.0–149.0)
VLDL: 17.4 mg/dL (ref 0.0–40.0)

## 2012-07-16 LAB — ALT: ALT: 44 U/L (ref 0–53)

## 2012-07-16 NOTE — Assessment & Plan Note (Addendum)
Sx well controlled RF PRN Needs to sign the controlled substance agreement

## 2012-07-16 NOTE — Assessment & Plan Note (Signed)
Good compliance with Lipitor, no apparent side effects. Will check FLP, AST, ALT. Needs to improve diet, counseled

## 2012-07-16 NOTE — Assessment & Plan Note (Addendum)
New problem. Pain for 2 months, exam is benign. I recommend to use Motrin as needed and ice pack at the end of the day as he walks and stands on his feet all day. If not better he will need to be referred to sports medicine.

## 2012-07-16 NOTE — Patient Instructions (Addendum)
Motrin 200 mg 2 tablets every 6 hours as needed for pain. Always take it with food. Watch for stomach side effects (gastritis): nausea, stomach pain, change in the color of stools. Ice pack to left knee at night if needed  Call if no better  ---- Next visit 6 months for a physical, please schedule

## 2012-07-16 NOTE — Progress Notes (Signed)
  Subjective:    Patient ID: Michael Berg, male    DOB: [birth year], 47 y.o.   MRN: [id number]  HPI  Acute visit Complaining of knee pain x 2 months, left side, the pain can be in the whole knee  or in the frontal area, usually depends on what position his knee is.  Since he is here, we also talk about his chronic issues: Anxiety, good compliance with prozac; wants to take as little xanax as  possible, usually uses 1 at night instead of 2 . High cholesterol, takes Lipitor as directed, very seldom forgets to take it.  Past Medical History  Diagnosis Date  . Dyslipidemia   . Anxiety and depression   . Insomnia    Past Surgical History  Procedure Laterality Date  . Appendectomy      age 76     Review of Systems Denies any swelling or any injury at the knee . Denies aches or pains related to Lipitor. Reports diet is not very healthy    Objective:   Physical Exam General -- alert, well-developed, NAD .     Extremities-- no pretibial edema bilaterally R knee normal L knee: no effusion, redness, swelling deformity. ROM normal, stable joint.  Neurologic-- alert & oriented X3 and strength normal in all extremities. Psych-- Cognition and judgment appear intact. Alert and cooperative with normal attention span and concentration.  not anxious appearing and not depressed appearing.      Assessment & Plan:

## 2012-07-22 ENCOUNTER — Encounter: Payer: Self-pay | Admitting: *Deleted

## 2012-09-04 ENCOUNTER — Other Ambulatory Visit: Payer: Self-pay | Admitting: Family Medicine

## 2012-09-07 NOTE — Telephone Encounter (Signed)
Refill done.  

## 2012-09-08 ENCOUNTER — Telehealth: Payer: Self-pay | Admitting: *Deleted

## 2012-09-08 ENCOUNTER — Other Ambulatory Visit: Payer: Self-pay | Admitting: Internal Medicine

## 2012-09-08 DIAGNOSIS — E785 Hyperlipidemia, unspecified: Secondary | ICD-10-CM

## 2012-09-08 DIAGNOSIS — F329 Major depressive disorder, single episode, unspecified: Secondary | ICD-10-CM

## 2012-09-08 MED ORDER — FLUOXETINE HCL 20 MG PO CAPS: ORAL_CAPSULE | ORAL | Status: DC

## 2012-09-08 MED ORDER — ATORVASTATIN CALCIUM 80 MG PO TABS: ORAL_TABLET | ORAL | Status: DC

## 2012-09-08 NOTE — Telephone Encounter (Signed)
Spoke with pt who requests refill on alprazolam. Last OV was 4/24 and it was noted that sx were well controlled. You also wrote to refill prn. This medication was last filled on 08/27/11 for #60 with 4RF. Pt stated during OV that he only takes 1 tab q hs prn. Do you want to fill for #30 or #60? Any additional refills?

## 2012-09-08 NOTE — Telephone Encounter (Signed)
Refill for Lipitor and Prozac sent to [facility] for 90 day supply

## 2012-09-08 NOTE — Telephone Encounter (Signed)
Refill done.  

## 2012-09-08 NOTE — Telephone Encounter (Addendum)
Okay 60, 2 refills;  if not done needs a UDS. Thank you

## 2012-09-09 MED ORDER — ALPRAZOLAM ER 0.5 MG PO TB24: ORAL_TABLET | ORAL | Status: DC

## 2012-09-09 NOTE — Addendum Note (Signed)
Addended by: Edwena Felty T on: 09/09/2012 08:10 AM   Modules accepted: Orders

## 2012-09-09 NOTE — Telephone Encounter (Signed)
Refill done.  

## 2012-09-24 ENCOUNTER — Telehealth: Payer: Self-pay | Admitting: Internal Medicine

## 2012-09-24 NOTE — Telephone Encounter (Signed)
Patient Information:  Caller Name: Chais  Phone: [phone]  Patient: Michael Berg, Michael Berg  Gender: Male  DOB: 1966-02-29  Age: 47 Years  PCP: Willow Ora  Office Follow Up:  Does the office need to follow up with this patient?: Yes  Instructions For The Office: Disposition - see today - no appts available.  Pt questioning if he should just go for xray and where.  Please advise.   Symptoms  Reason For Call & Symptoms: Onset 09/20/12 left pinky finger at joint/knuckle pain, unable to put pressure due to pain, able to bend without difficulty.  No injury that pt is aware of.  09/24/12 pain continues rates 6/10 with pressure, if not using 1/10, and 10/10 at night when asleep, will awaken pt, joint is mildly swollen, mildly numb.   Pt wanting to know if needs to go for xray.  Reviewed Health History In EMR: Yes  Reviewed Medications In EMR: Yes  Reviewed Allergies In EMR: Yes  Reviewed Surgeries / Procedures: Yes  Date of Onset of Symptoms: 09/20/2012  Guideline(s) Used:  Finger Pain  Disposition Per Guideline:   See Today in Office  Reason For Disposition Reached:   Numbness (i.e., loss of sensation) in hand or fingers of new-onset  Advice Given:  Pain Medicines:  For pain relief, you can take either acetaminophen, ibuprofen, or naproxen.  Call Back If:  Fever occurs  Redness or swelling appears  You become worse.  Patient Will Follow Care Advice:  YES

## 2012-09-24 NOTE — Telephone Encounter (Signed)
Please advise 

## 2012-09-24 NOTE — Telephone Encounter (Signed)
Left message on VM informing patient that he needs to be evaluated at Urgent Care. I also offered Saturday [facility] as an option, if patient ok to wait until Saturday. Patient also informed about how to contact after hours MD or Nurse, if further questions or concerns

## 2012-10-24 ENCOUNTER — Ambulatory Visit (INDEPENDENT_AMBULATORY_CARE_PROVIDER_SITE_OTHER): Payer: BC Managed Care – PPO | Admitting: Family Medicine

## 2012-10-24 ENCOUNTER — Ambulatory Visit: Payer: BC Managed Care – PPO

## 2012-10-24 VITALS — BP 130/81 | HR 75 | Temp 98.6°F | Resp 18 | Ht 74.0 in | Wt 223.0 lb

## 2012-10-24 DIAGNOSIS — M79645 Pain in left finger(s): Secondary | ICD-10-CM

## 2012-10-24 DIAGNOSIS — M79609 Pain in unspecified limb: Secondary | ICD-10-CM

## 2012-10-24 MED ORDER — MELOXICAM 15 MG PO TABS: 15.0000 mg | ORAL_TABLET | Freq: Every day | ORAL | Status: DC

## 2012-10-24 NOTE — Progress Notes (Signed)
History and physical exam reviewed in detail.  Hand xray reviewed and negative. Agree with assessment and plan.

## 2012-10-24 NOTE — Patient Instructions (Signed)
Wear the splint to reduce movement and to protect the joint. If the pain persists in another 2-3 weeks, please return for additional evaluation.

## 2012-10-24 NOTE — Progress Notes (Signed)
  Subjective:    Patient ID: Michael Berg, male    DOB: 1966-02-08, 47 y.o.   MRN: [id number]  HPI This 47 y.o. male presents for evaluation of 5 weeks of LEFT 5th finger pain.  RIGHT hand dominant.  No specific injury, though he recalls maybe abduction that hurt momentarily around the time this pain began.  No swelling, bruising, redness.  No significant benefit with NSAIDS. Buddy tapes it to the 4th finger at night.   Review of Systems As above.    Objective:   Physical Exam BP 130/81  Pulse 75  Temp(Src) 98.6 F (37 C) (Oral)  Resp 18  Ht 6\' 2"  (1.88 m)  Wt 223 lb (101.152 kg)  BMI 28.62 kg/m2  SpO2 100% WDWN male, A&O x 3.  Normal appearing hands and fingers without lesions.  Strong pulses bilaterally.  Near full ROM-Pain with full flexion of the LEFT 5th finger. Pain with ulnar deviation of the MCP, no pain with full extension or radial deviation.  Joint stable.   LEFT 5th MCP: [facility] reading (PRIMARY) by  Dr. Katrinka Blazing.  Normal MCP.      Assessment & Plan:  Finger pain, left - Plan: DG Finger Little Left, meloxicam (MOBIC) 15 MG tablet Finger splint. Anticipatory guidance.  RTC 2 weeks if symptoms persist, sooner if they worsen.  Fernande Bras, PA-C Physician Assistant-Certified [facility]

## 2012-11-24 ENCOUNTER — Telehealth: Payer: Self-pay | Admitting: Internal Medicine

## 2012-11-24 ENCOUNTER — Other Ambulatory Visit: Payer: Self-pay | Admitting: *Deleted

## 2012-11-24 DIAGNOSIS — F329 Major depressive disorder, single episode, unspecified: Secondary | ICD-10-CM

## 2012-11-24 MED ORDER — FLUOXETINE HCL 20 MG PO CAPS: ORAL_CAPSULE | ORAL | Status: DC

## 2012-11-24 NOTE — Telephone Encounter (Signed)
Patient called stating he is out of town in [place] and needs [facility] [address] [facility] [place], [place].

## 2012-11-24 NOTE — Telephone Encounter (Signed)
[facility] [place]

## 2013-01-12 ENCOUNTER — Telehealth: Payer: Self-pay

## 2013-01-12 NOTE — Telephone Encounter (Addendum)
Left message for call back  Patient had to reschedule due to out of town for work

## 2013-01-15 ENCOUNTER — Encounter: Payer: BC Managed Care – PPO | Admitting: Internal Medicine

## 2013-04-24 ENCOUNTER — Other Ambulatory Visit: Payer: Self-pay | Admitting: Internal Medicine

## 2013-04-26 ENCOUNTER — Telehealth: Payer: Self-pay | Admitting: *Deleted

## 2013-04-26 MED ORDER — ALPRAZOLAM ER 0.5 MG PO TB24: ORAL_TABLET | ORAL | Status: DC

## 2013-04-26 NOTE — Telephone Encounter (Signed)
rx refill - xanax 0.5mg  Last OV- 07/16/12 Last refilled- 09/09/12 #60 / 2 rf UDS contract- 07/25/12

## 2013-04-26 NOTE — Telephone Encounter (Signed)
ok #30, I don't see a UDS Advise pt------------needs OV before next RF

## 2013-04-26 NOTE — Addendum Note (Signed)
Addended by: Kathlene November E on: 04/26/2013 10:45 AM   Modules accepted: Orders

## 2016-02-13 ENCOUNTER — Telehealth: Payer: Self-pay | Admitting: Internal Medicine

## 2016-02-13 NOTE — Telephone Encounter (Signed)
Ok, schedule a 30 min appointment, ok to put two 15-min appointments together

## 2016-02-13 NOTE — Telephone Encounter (Signed)
Relation to [id number] Call back [phone]   Reason for call:  Patient would like to reestablish with PCP, please advise

## 2016-02-14 NOTE — Telephone Encounter (Signed)
lvm advising patient to schedule appointment °

## 2016-02-19 ENCOUNTER — Ambulatory Visit (INDEPENDENT_AMBULATORY_CARE_PROVIDER_SITE_OTHER): Payer: Managed Care, Other (non HMO) | Admitting: Internal Medicine

## 2016-02-19 ENCOUNTER — Encounter: Payer: Self-pay | Admitting: Internal Medicine

## 2016-02-19 VITALS — BP 115/78 | HR 67 | Temp 97.9°F | Resp 18 | Ht 74.0 in | Wt 214.6 lb

## 2016-02-19 DIAGNOSIS — F5101 Primary insomnia: Secondary | ICD-10-CM | POA: Diagnosis not present

## 2016-02-19 DIAGNOSIS — E785 Hyperlipidemia, unspecified: Secondary | ICD-10-CM

## 2016-02-19 DIAGNOSIS — Z113 Encounter for screening for infections with a predominantly sexual mode of transmission: Secondary | ICD-10-CM | POA: Diagnosis not present

## 2016-02-19 DIAGNOSIS — Z114 Encounter for screening for human immunodeficiency virus [HIV]: Secondary | ICD-10-CM

## 2016-02-19 DIAGNOSIS — R252 Cramp and spasm: Secondary | ICD-10-CM

## 2016-02-19 DIAGNOSIS — Z125 Encounter for screening for malignant neoplasm of prostate: Secondary | ICD-10-CM

## 2016-02-19 MED ORDER — ZOLPIDEM TARTRATE 10 MG PO TABS: 10.0000 mg | ORAL_TABLET | Freq: Every evening | ORAL | 2 refills | Status: DC | PRN

## 2016-02-19 NOTE — Progress Notes (Signed)
Subjective:    Patient ID: Michael Berg, male    DOB: 08/29/1965, 50 y.o.   MRN: [id number]  DOS:  02/19/2016 Type of visit - description : New patient Interval history: Here to reestablish, I used to see him up to 3 years ago, has seen another provider. Has several concerns: High cholesterol: On Lipitor 80 mg, ran out a few weeks ago, would like to restart. Difficulty sleeping: A chronic problem, currently taking melatonin with mix results. Xanax used to work better. Leg cramps at night, a chronic issue    Review of Systems Denies feeling actually anxious or depressed. In general feeling well  Past Medical History:  Diagnosis Date  . Allergy   . Anxiety and depression    Hx of anxiety and Depression due to divorce. Not experiencing any concerns at this moment.  . Dyslipidemia   . Insomnia     Past Surgical History:  Procedure Laterality Date  . APPENDECTOMY     age 31    Social History   Social History  . Marital status: Divorced    Spouse name: N/A  . Number of children: 2  . Years of education: N/A   Occupational History  . 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 Smoking status: Former Research scientist (life sciences)  . Smokeless tobacco: Never Used     Comment: quit ~ 2014  . Alcohol use Yes     Comment: socially  . Drug use: No  . Sexual activity: Yes   Other Topics Concern  . Not on file   Social History Narrative   Lives w/ youngest children.    Has a girlfriend      Family History  Problem Relation Age of Onset  . Coronary artery disease Father     CABG at age 43  . Colon cancer      uncle dx at age 35 (?)  . Diabetes Paternal Grandmother   . Prostate cancer Neg Hx        Medication List       Accurate as of 02/19/16 11:59 PM. Always use your most recent med list.          atorvastatin 80 MG tablet Commonly known as:  LIPITOR TAKE ONE TABLET BY MOUTH   NIGHTLY AT BEDTIME   multivitamin  tablet Take 1 tablet by mouth daily.   zolpidem 10 MG tablet Commonly known as:  AMBIEN Take 1 tablet (10 mg total) by mouth at bedtime as needed for sleep.   ZYRTEC ALLERGY PO Take by mouth.          Objective:   Physical Exam BP 115/78 (BP Location: Right Arm, Patient Position: Sitting, Cuff Size: Large)   Pulse 67   Temp 97.9 F (36.6 C) (Oral)   Resp 18   Ht 6\' 2"  (1.88 m)   Wt 214 lb 9.6 oz (97.3 kg)   SpO2 100% Comment: RA  BMI 27.55 kg/m   General:   Well developed, well nourished . NAD.  HEENT:  Normocephalic . Face symmetric, atraumatic Lungs:  CTA B Normal respiratory effort, no intercostal retractions, no accessory muscle use. Heart: RRR,  no murmur.  No pretibial edema bilaterally  Skin: Not pale. Not jaundice Neurologic:  alert & oriented X3.  Speech normal, gait appropriate for age and unassisted Psych--  Cognition and judgment appear intact.  Cooperative with normal attention span and concentration.  Behavior appropriate. No anxious  or depressed appearing.      Assessment & Plan:   Assessment Hyperlipidemia Insomnia  H/o  anxiety depression related to her divorce  Plan: Hyperlipidemia: long history of high cholesterol, at baseline his LDL was as high as 260. Pravachol did not control his LDL, he took Lipitor before, has some aches and pains however in the last couple of years he has been able to take it without any problems. Discussed  possibly switch to Crestor but again we agreed to restart Lipitor 80 mg and recheck labs in 6 weeks. Insomnia: Chronic issue, currently on melatonin, recommend good sleep habits and add Ambien. In the past Xanax helped but will avoid controlled substances Leg cramps, at night: Recommend tonic water trial Likes a PSA and STD check (HIV and RPR). See instructions. RTC 6 months, CPX  Today, I spent more than 33  min with the patient: >50% of the time counseling regards pros and cons of switching from Lipitor to  Crestor, reviewing his chart regards previous cholesterol treatment. Also discussing good sleep habits and the need to stay away from controlled substances. Multiple questions answered to the best of my ability.

## 2016-02-19 NOTE — Progress Notes (Signed)
Pre visit review using our clinic review tool, if applicable. No additional management support is needed unless otherwise documented below in the visit note. 

## 2016-02-19 NOTE — Patient Instructions (Signed)
GO TO THE FRONT DESK Schedule labs to be done in 6 to 7 weeks  (fasting)  Schedule your next appointment for a  Physical , fasting, 6 months from today  To sleep: Melatonin ambien as needed    HEALTHY SLEEP Sleep hygiene: Basic rules for a good night's sleep  Sleep only as much as you need to feel rested and then get out of bed  Keep a regular sleep schedule  Avoid forcing sleep  Exercise regularly for at least 20 minutes, preferably 4 to 5 hours before bedtime  Avoid caffeinated beverages after lunch  Avoid alcohol near bedtime: no "night cap"  Avoid smoking, especially in the evening  Do not go to bed hungry  Adjust bedroom environment  Avoid prolonged use of light-emitting screens before bedtime   Deal with your worries before bedtime

## 2016-02-19 NOTE — Progress Notes (Deleted)
Pre visit review using our clinic review tool, if applicable. No additional management support is needed unless otherwise documented below in the visit note. 

## 2016-02-20 ENCOUNTER — Other Ambulatory Visit: Payer: Self-pay | Admitting: Internal Medicine

## 2016-02-20 DIAGNOSIS — R252 Cramp and spasm: Secondary | ICD-10-CM | POA: Insufficient documentation

## 2016-02-20 DIAGNOSIS — Z09 Encounter for follow-up examination after completed treatment for conditions other than malignant neoplasm: Secondary | ICD-10-CM | POA: Insufficient documentation

## 2016-02-20 NOTE — Assessment & Plan Note (Signed)
Hyperlipidemia: long history of high cholesterol, at baseline his LDL was as high as 260. Pravachol did not control his LDL, he took Lipitor before, has some aches and pains however in the last couple of years he has been able to take it without any problems. Discussed  possibly switch to Crestor but again we agreed to restart Lipitor 80 mg and recheck labs in 6 weeks. Insomnia: Chronic issue, currently on melatonin, recommend good sleep habits and add Ambien. In the past Xanax helped but will avoid controlled substances Leg cramps, at night: Recommend tonic water trial Likes a PSA and STD check (HIV and RPR). See instructions. RTC 6 months, CPX

## 2016-03-19 ENCOUNTER — Other Ambulatory Visit: Payer: Self-pay

## 2016-03-19 MED ORDER — ATORVASTATIN CALCIUM 80 MG PO TABS: 80.0000 mg | ORAL_TABLET | Freq: Every day | ORAL | 0 refills | Status: DC

## 2016-03-20 ENCOUNTER — Other Ambulatory Visit: Payer: Self-pay

## 2016-03-20 ENCOUNTER — Other Ambulatory Visit: Payer: Self-pay | Admitting: Internal Medicine

## 2016-03-20 MED ORDER — ATORVASTATIN CALCIUM 80 MG PO TABS: 80.0000 mg | ORAL_TABLET | Freq: Every day | ORAL | 0 refills | Status: DC

## 2016-04-01 ENCOUNTER — Other Ambulatory Visit (INDEPENDENT_AMBULATORY_CARE_PROVIDER_SITE_OTHER): Payer: Managed Care, Other (non HMO)

## 2016-04-01 DIAGNOSIS — Z125 Encounter for screening for malignant neoplasm of prostate: Secondary | ICD-10-CM | POA: Diagnosis not present

## 2016-04-01 DIAGNOSIS — E785 Hyperlipidemia, unspecified: Secondary | ICD-10-CM | POA: Diagnosis not present

## 2016-04-01 DIAGNOSIS — Z113 Encounter for screening for infections with a predominantly sexual mode of transmission: Secondary | ICD-10-CM

## 2016-04-01 DIAGNOSIS — Z114 Encounter for screening for human immunodeficiency virus [HIV]: Secondary | ICD-10-CM

## 2016-04-01 LAB — COMPREHENSIVE METABOLIC PANEL
ALT: 28 U/L (ref 0–53)
AST: 22 U/L (ref 0–37)
Albumin: 4.5 g/dL (ref 3.5–5.2)
Alkaline Phosphatase: 48 U/L (ref 39–117)
BUN: 18 mg/dL (ref 6–23)
CALCIUM: 9.5 mg/dL (ref 8.4–10.5)
CHLORIDE: 103 meq/L (ref 96–112)
CO2: 31 meq/L (ref 19–32)
CREATININE: 0.95 mg/dL (ref 0.40–1.50)
GFR: 89.79 mL/min (ref >60.00)
Glucose, Bld: 104 mg/dL — ABNORMAL HIGH (ref 70–99)
Potassium: 3.8 mEq/L (ref 3.5–5.1)
SODIUM: 140 meq/L (ref 135–145)
Total Bilirubin: 0.8 mg/dL (ref 0.2–1.2)
Total Protein: 7 g/dL (ref 6.0–8.3)

## 2016-04-01 LAB — LIPID PANEL
CHOL/HDL RATIO: 5
Cholesterol: 191 mg/dL (ref 0–200)
HDL: 42.2 mg/dL (ref >39.00)
LDL CALC: 133 mg/dL — AB (ref 0–99)
NonHDL: 148.54
TRIGLYCERIDES: 77 mg/dL (ref 0.0–149.0)
VLDL: 15.4 mg/dL (ref 0.0–40.0)

## 2016-04-01 LAB — PSA: PSA: 3.13 ng/mL (ref 0.10–4.00)

## 2016-04-01 LAB — HIV ANTIBODY (ROUTINE TESTING W REFLEX): HIV: NONREACTIVE

## 2016-04-02 LAB — RPR

## 2016-06-12 ENCOUNTER — Telehealth: Payer: Self-pay | Admitting: Internal Medicine

## 2016-06-12 NOTE — Telephone Encounter (Signed)
°  Relation to [id number] Call back number: [phone] Pharmacy:  Reason for call:  Pt states he would like to speak with dr. Larose Kells, regarding his cholesterol medication states he has been losing weight since he has been on the medication wants to make sure everything is ok , states he does not no which medication it is the sleep meds or the cholesterol meds. Please advise

## 2016-06-12 NOTE — Telephone Encounter (Signed)
Per PCP, needs OV to discuss.

## 2016-06-13 NOTE — Telephone Encounter (Signed)
Patient states he can not wait until next available appointment (Monday) to see provider. States he can't sleep and if he can't sleep he can't work. Appt scheduled for 10:30 Monday.

## 2016-06-14 NOTE — Telephone Encounter (Signed)
We could see him today at 2:30, please call patient

## 2016-06-14 NOTE — Telephone Encounter (Signed)
Please advise 

## 2016-06-17 ENCOUNTER — Encounter: Payer: Self-pay | Admitting: Internal Medicine

## 2016-06-17 ENCOUNTER — Ambulatory Visit (INDEPENDENT_AMBULATORY_CARE_PROVIDER_SITE_OTHER): Payer: Managed Care, Other (non HMO) | Admitting: Internal Medicine

## 2016-06-17 VITALS — BP 118/72 | HR 68 | Temp 98.1°F | Resp 14 | Ht 74.0 in | Wt 206.0 lb

## 2016-06-17 DIAGNOSIS — F5101 Primary insomnia: Secondary | ICD-10-CM

## 2016-06-17 DIAGNOSIS — R634 Abnormal weight loss: Secondary | ICD-10-CM

## 2016-06-17 DIAGNOSIS — F341 Dysthymic disorder: Secondary | ICD-10-CM

## 2016-06-17 LAB — COMPREHENSIVE METABOLIC PANEL
ALT: 24 U/L (ref 0–53)
AST: 21 U/L (ref 0–37)
Albumin: 4.9 g/dL (ref 3.5–5.2)
Alkaline Phosphatase: 45 U/L (ref 39–117)
BUN: 14 mg/dL (ref 6–23)
CALCIUM: 9.8 mg/dL (ref 8.4–10.5)
CHLORIDE: 103 meq/L (ref 96–112)
CO2: 31 meq/L (ref 19–32)
CREATININE: 0.98 mg/dL (ref 0.40–1.50)
GFR: 86.55 mL/min (ref >60.00)
GLUCOSE: 97 mg/dL (ref 70–99)
Potassium: 4.8 mEq/L (ref 3.5–5.1)
SODIUM: 139 meq/L (ref 135–145)
Total Bilirubin: 0.7 mg/dL (ref 0.2–1.2)
Total Protein: 7.7 g/dL (ref 6.0–8.3)

## 2016-06-17 LAB — CBC WITH DIFFERENTIAL/PLATELET
BASOS PCT: 0.6 % (ref 0.0–3.0)
Basophils Absolute: 0 10*3/uL (ref 0.0–0.1)
EOS ABS: 0.1 10*3/uL (ref 0.0–0.7)
Eosinophils Relative: 1.3 % (ref 0.0–5.0)
HEMATOCRIT: 44.9 % (ref 39.0–52.0)
Hemoglobin: 15.3 g/dL (ref 13.0–17.0)
LYMPHS ABS: 1.4 10*3/uL (ref 0.7–4.0)
LYMPHS PCT: 32.9 % (ref 12.0–46.0)
MCHC: 34 g/dL (ref 30.0–36.0)
MCV: 86 fl (ref 78.0–100.0)
Monocytes Absolute: 0.4 10*3/uL (ref 0.1–1.0)
Monocytes Relative: 10 % (ref 3.0–12.0)
NEUTROS ABS: 2.3 10*3/uL (ref 1.4–7.7)
Neutrophils Relative %: 55.2 % (ref 43.0–77.0)
PLATELETS: 275 10*3/uL (ref 150.0–400.0)
RBC: 5.22 Mil/uL (ref 4.22–5.81)
RDW: 12.7 % (ref 11.5–15.5)
WBC: 4.1 10*3/uL (ref 4.0–10.5)

## 2016-06-17 LAB — TSH: TSH: 1.43 u[IU]/mL (ref 0.35–4.50)

## 2016-06-17 MED ORDER — FLUOXETINE HCL 20 MG PO TABS: 20.0000 mg | ORAL_TABLET | Freq: Every day | ORAL | 1 refills | Status: DC

## 2016-06-17 MED ORDER — ALPRAZOLAM 0.5 MG PO TABS: 0.5000 mg | ORAL_TABLET | Freq: Every evening | ORAL | 0 refills | Status: DC | PRN

## 2016-06-17 NOTE — Progress Notes (Signed)
Subjective:    Patient ID: Michael Berg, male    DOB: October 19, 1965, 51 y.o.   MRN: [id number]  DOS:  06/17/2016 Type of visit - description : Acute visit: Weight loss, anxiety Interval history: Patient is concerned about weight loss, states that in the last few months his appetite has decrease and at the same times he is trying to eat healthier. A few months ago started Ambien, patient felt that the medication could be the culprit of wt loss  and a stop taking it. Besides, it wasn't helping as much as he liked with insomnia.  Also concerned about anxiety, states that he has a lot of issues going on at work, he has been feeling quite anxious, denies depression or suicidal ideas. Thinks he is ready for medication:"The things you gave me before worked"   [employer] from Last 3 Encounters:  06/17/16 206 lb (93.4 kg)  02/19/16 214 lb 9.6 oz (97.3 kg)  10/24/12 223 lb (101.2 kg)     Review of Systems Denies fever, chills, night sweats. No lower extremity edema No nausea, vomiting, diarrhea or blood in the stools. No postprandial abdominal pains. No joint aches  Past Medical History:  Diagnosis Date  . Allergy   . Anxiety and depression    Hx of anxiety and Depression due to divorce. Not experiencing any concerns at this moment.  . Dyslipidemia   . Insomnia     Past Surgical History:  Procedure Laterality Date  . APPENDECTOMY     age 27    Social History   Social History  . Marital status: Divorced    Spouse name: N/A  . Number of children: 2  . Years of education: N/A   Occupational History  . 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 Smoking status: Former Research scientist (life sciences)  . Smokeless tobacco: Never Used     Comment: occ has a cigarrete   . Alcohol use Yes     Comment: socially  . Drug use: No  . Sexual activity: Yes   Other Topics Concern  . Not on file   Social History Narrative   Household- pt and youngest  child      Allergies as of 06/17/2016   No Known Allergies     Medication List       Accurate as of 06/17/16 11:59 PM. Always use your most recent med list.          ALPRAZolam 0.5 MG tablet Commonly known as:  XANAX Take 1-2 tablets (0.5-1 mg total) by mouth at bedtime as needed for sleep.   atorvastatin 80 MG tablet Commonly known as:  LIPITOR Take 1 tablet (80 mg total) by mouth daily.   FLUoxetine 20 MG tablet Commonly known as:  PROZAC Take 1 tablet (20 mg total) by mouth daily.   multivitamin tablet Take 1 tablet by mouth daily.   ZYRTEC ALLERGY PO Take by mouth.          Objective:   Physical Exam BP 118/72 (BP Location: Left Arm, Patient Position: Sitting, Cuff Size: Normal)   Pulse 68   Temp 98.1 F (36.7 C) (Oral)   Resp 14   Ht 6\' 2"  (1.88 m)   Wt 206 lb (93.4 kg)   SpO2 97%   BMI 26.45 kg/m  General:   Well developed, well nourished . NAD.  HEENT:  Normocephalic . Face symmetric, atraumatic Neck: No thyromegaly Lungs:  CTA B Normal respiratory effort, no intercostal retractions, no accessory muscle use. Heart: RRR,  no murmur.  No pretibial edema bilaterally  Skin: Not pale. Not jaundice Neurologic:  alert & oriented X3.  Speech normal, gait appropriate for age and unassisted Psych--  Cognition and judgment appear intact.  Cooperative with normal attention span and concentration.  Behavior appropriate Slightly anxious but not depressed appearing     Assessment & Plan:   Assessment Hyperlipidemia Insomnia  H/o  anxiety depression related to her divorce  Plan:  Anxiety> depression: Sxs are work-related, few years ago when he was getting divorce, he did well with fluoxetine and Xanax. Will restart the medications, suicidality discussed Insomnia: Likely due to anxiety, Ambien did not help as much as he liked,  rx Xanax today Weight loss: Likely a combination of anxiety, lack of appetite and eating healthier. I doubt is related to  Ambien as the patient believes, he actually took it only for a few days. For completeness we'll get CBC, TSH and CMP and reassess on RTC RTC 4-5 weeks

## 2016-06-17 NOTE — Progress Notes (Signed)
Pre visit review using our clinic review tool, if applicable. No additional management support is needed unless otherwise documented below in the visit note. 

## 2016-06-17 NOTE — Patient Instructions (Signed)
GO TO THE LAB : Get the blood work     GO TO THE FRONT DESK Schedule your next appointment for check up in 4-5 weeks  Start fluoxetine: The first week take only half tablet a day, then go up to 1 tablet daily  Watch for side effects including suicidal ideas  Use Xanax as needed for sleep, 1 or 2 tablets at night.

## 2016-06-18 NOTE — Assessment & Plan Note (Signed)
Anxiety> depression: Sxs are work-related, few years ago when he was getting divorce, he did well with fluoxetine and Xanax. Will restart the medications, suicidality discussed Insomnia: Likely due to anxiety, Ambien did not help as much as he liked,  rx Xanax today Weight loss: Likely a combination of anxiety, lack of appetite and eating healthier. I doubt is related to Ambien as the patient believes, he actually took it only for a few days. For completeness we'll get CBC, TSH and CMP and reassess on RTC RTC 4-5 weeks

## 2016-07-11 ENCOUNTER — Ambulatory Visit: Payer: Managed Care, Other (non HMO) | Admitting: Family

## 2016-07-16 ENCOUNTER — Encounter: Payer: Self-pay | Admitting: Internal Medicine

## 2016-07-16 ENCOUNTER — Ambulatory Visit (INDEPENDENT_AMBULATORY_CARE_PROVIDER_SITE_OTHER): Payer: Managed Care, Other (non HMO) | Admitting: Internal Medicine

## 2016-07-16 VITALS — BP 132/68 | HR 87 | Temp 98.3°F | Resp 12 | Ht 74.0 in | Wt 210.0 lb

## 2016-07-16 DIAGNOSIS — F5101 Primary insomnia: Secondary | ICD-10-CM

## 2016-07-16 DIAGNOSIS — F341 Dysthymic disorder: Secondary | ICD-10-CM

## 2016-07-16 MED ORDER — ALPRAZOLAM 0.5 MG PO TABS: 0.5000 mg | ORAL_TABLET | Freq: Every evening | ORAL | 1 refills | Status: DC | PRN

## 2016-07-16 MED ORDER — FLUOXETINE HCL 20 MG PO TABS: 20.0000 mg | ORAL_TABLET | Freq: Every day | ORAL | 6 refills | Status: DC

## 2016-07-16 NOTE — Patient Instructions (Addendum)
GO TO THE LAB : Provide a urine sample for a UDS     GO TO THE FRONT DESK Schedule your next appointment for a  physical exam in 3-4 months   Try to minimize the use of Xanax: 1 tablet?  1.5 tablet?

## 2016-07-16 NOTE — Progress Notes (Signed)
Pre visit review using our clinic review tool, if applicable. No additional management support is needed unless otherwise documented below in the visit note. 

## 2016-07-16 NOTE — Progress Notes (Signed)
Subjective:    Patient ID: Michael Berg, male    DOB: 03-30-1965, 51 y.o.   MRN: [id number]  DOS:  07/16/2016 Type of visit - description : Follow-up Interval history: Compliance with fluoxetine and Xanax. Symptoms have definitely decreased. No apparent side effects. He described himself as less agitated. Sleeping up to 7 hours every night which is very good for him.  Wt Readings from Last 3 Encounters:  07/16/16 210 lb (95.3 kg)  06/17/16 206 lb (93.4 kg)  02/19/16 214 lb 9.6 oz (97.3 kg)    Review of Systems  Appetite is improving, no weight loss anymore. No nausea, vomiting, diarrhea. No suicidal ideas.  Past Medical History:  Diagnosis Date  . Allergy   . Anxiety and depression    Hx of anxiety and Depression due to divorce. Not experiencing any concerns at this moment.  . Dyslipidemia   . Insomnia     Past Surgical History:  Procedure Laterality Date  . APPENDECTOMY     age 7    Social History   Social History  . Marital status: Divorced    Spouse name: N/A  . Number of children: 2  . Years of education: N/A   Occupational History  . 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 Smoking status: Former Research scientist (life sciences)  . Smokeless tobacco: Never Used     Comment: occ has a cigarrete   . Alcohol use Yes     Comment: socially  . Drug use: No  . Sexual activity: Yes   Other Topics Concern  . Not on file   Social History Narrative   Household- pt and youngest child      Allergies as of 07/16/2016   No Known Allergies     Medication List       Accurate as of 07/16/16  2:42 PM. Always use your most recent med list.          ALPRAZolam 0.5 MG tablet Commonly known as:  XANAX Take 1-2 tablets (0.5-1 mg total) by mouth at bedtime as needed for sleep.   atorvastatin 80 MG tablet Commonly known as:  LIPITOR Take 1 tablet (80 mg total) by mouth daily.   FLUoxetine 20 MG tablet Commonly known as:   PROZAC Take 1 tablet (20 mg total) by mouth daily.   multivitamin tablet Take 1 tablet by mouth daily.   ZYRTEC ALLERGY PO Take by mouth.          Objective:   Physical Exam BP 132/68 (BP Location: Left Arm, Patient Position: Sitting, Cuff Size: Normal)   Pulse 87   Temp 98.3 F (36.8 C) (Oral)   Resp 12   Ht 6\' 2"  (1.88 m)   Wt 210 lb (95.3 kg)   SpO2 98%   BMI 26.96 kg/m  General:   Well developed, well nourished . NAD.  HEENT:  Normocephalic . Face symmetric, atraumatic Skin: Not pale. Not jaundice Neurologic:  alert & oriented X3.  Speech normal, gait appropriate for age and unassisted Psych--  Cognition and judgment appear intact.  Cooperative with normal attention span and concentration.  Behavior appropriate. No anxious or depressed appearing.      Assessment & Plan:   Assessment anxiety depression (divorce related in the past, sx resurface ~01-2016 work related) Insomnia Hyperlipidemia  PLAN:  Depression, insomnia: Doing better, refill fluoxetine for 6 months . He takes ~ 2 Xanax at bedtime, will rx #60  and 2 refills, rec to minimize its use. UDS and contract today. Weight loss: See previous visit, Labs were satisfactory. Appetite is improving, has gained few pounds. RTC 3-4 months,     3-26 Anxiety> depression: Sxs are work-related, few years ago when he was getting divorce, he did well with fluoxetine and Xanax. Will restart the medications, suicidality discussed Insomnia: Likely due to anxiety, Ambien did not help as much as he liked,  rx Xanax today Weight loss: Likely a combination of anxiety, lack of appetite and eating healthier. I doubt is related to Ambien as the patient believes, he actually took it only for a few days. For completeness we'll get CBC, TSH and CMP and reassess on RTC RTC 4-5 weeks

## 2016-07-16 NOTE — Progress Notes (Signed)
[facility].

## 2016-07-16 NOTE — Assessment & Plan Note (Signed)
Depression, insomnia: Doing better, refill fluoxetine for 6 months . He takes ~ 2 Xanax at bedtime, will rx #60 and 2 refills, rec to minimize its use. UDS and contract today. Weight loss: See previous visit, Labs were satisfactory. Appetite is improving, has gained few pounds. RTC 3-4 months,

## 2016-07-17 ENCOUNTER — Other Ambulatory Visit: Payer: Managed Care, Other (non HMO)

## 2016-07-19 ENCOUNTER — Other Ambulatory Visit: Payer: Self-pay | Admitting: Internal Medicine

## 2016-07-25 ENCOUNTER — Other Ambulatory Visit: Payer: Self-pay

## 2016-07-25 MED ORDER — FLUOXETINE HCL 20 MG PO TABS: 20.0000 mg | ORAL_TABLET | Freq: Every day | ORAL | 1 refills | Status: DC

## 2016-07-26 ENCOUNTER — Telehealth: Payer: Self-pay

## 2016-07-26 NOTE — Telephone Encounter (Signed)
UDS: 07/18/2016  Negative for Alprazolam: PRN  Low risk per PCP 07/26/2016

## 2016-08-09 ENCOUNTER — Encounter: Payer: Self-pay | Admitting: Internal Medicine

## 2016-08-20 ENCOUNTER — Encounter: Payer: Managed Care, Other (non HMO) | Admitting: Internal Medicine

## 2016-10-01 ENCOUNTER — Other Ambulatory Visit: Payer: Self-pay | Admitting: Internal Medicine

## 2016-10-02 NOTE — Telephone Encounter (Signed)
Rx printed, awaiting MD signature.  

## 2016-10-02 NOTE — Telephone Encounter (Signed)
Pt is requesting refill on alprazolam 0.5mg  tabs.  Last OV: 07/16/2016 Last Fill: 07/16/2016 #60 and 1RF UDS: 07/18/2016 Low risk  [place] Controlled Substance Database printed; no issues noted.   Please advise.

## 2016-10-02 NOTE — Telephone Encounter (Signed)
[facility]  

## 2016-10-02 NOTE — Telephone Encounter (Signed)
Okay #60 and 2 refills 

## 2016-11-28 ENCOUNTER — Encounter: Payer: Self-pay | Admitting: Internal Medicine

## 2016-11-28 ENCOUNTER — Ambulatory Visit (INDEPENDENT_AMBULATORY_CARE_PROVIDER_SITE_OTHER): Payer: Managed Care, Other (non HMO) | Admitting: Internal Medicine

## 2016-11-28 VITALS — BP 126/80 | HR 81 | Temp 98.0°F | Resp 14 | Ht 74.0 in | Wt 201.2 lb

## 2016-11-28 DIAGNOSIS — Z0000 Encounter for general adult medical examination without abnormal findings: Secondary | ICD-10-CM

## 2016-11-28 DIAGNOSIS — L989 Disorder of the skin and subcutaneous tissue, unspecified: Secondary | ICD-10-CM

## 2016-11-28 DIAGNOSIS — H6192 Disorder of left external ear, unspecified: Secondary | ICD-10-CM

## 2016-11-28 NOTE — Patient Instructions (Signed)
   GO TO THE FRONT DESK  Schedule labs to be non-fasting tomorrow or next week  Schedule your next appointment for a routine checkup in 6 months

## 2016-11-28 NOTE — Assessment & Plan Note (Addendum)
-  Td 2012 --CCS: never had a cscope, 3 modalities discussed. For now elected an IFOB -prostate ca screening : Had a normal PSA few months ago. Reassess next year -Diet and exercise discussed -Labs: CMP, FLP, A1c, TSH. - EKG normal

## 2016-11-28 NOTE — Progress Notes (Signed)
Subjective:    Patient ID: Michael Berg, male    DOB: 08/24/1965, 51 y.o.   MRN: [id number]  DOS:  11/28/2016 Type of visit - description : cpx Interval history: In general feels very well, good compliance w/ medication. He continue to lose weight.   Wt Readings from Last 3 Encounters:  11/28/16 201 lb 4 oz (91.3 kg)  07/16/16 210 lb (95.3 kg)  06/17/16 206 lb (93.4 kg)     Review of Systems  Specifically denies nocturnal fever or chills. No dyspepsia or blood in the stools. No cough.  Other than above, a 14 point review of systems is negative     Past Medical History:  Diagnosis Date  . Allergy   . Anxiety and depression    Hx of anxiety and Depression due to divorce. Not experiencing any concerns at this moment.  . Dyslipidemia   . Insomnia     Past Surgical History:  Procedure Laterality Date  . APPENDECTOMY     age 32    Social History   Social History  . Marital status: Divorced    Spouse name: N/A  . Number of children: 2  . Years of education: N/A   Occupational History  . 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 Smoking status: Former Research scientist (life sciences)  . Smokeless tobacco: Never Used  . Alcohol use Yes     Comment: socially  . Drug use: No  . Sexual activity: Yes   Other Topics Concern  . Not on file   Social History Narrative   Household- pt and youngest child     Family History  Problem Relation Age of Onset  . Coronary artery disease Father        CABG at age 64  . Colon cancer Unknown        uncle dx at age 49 (?)  . Diabetes Paternal Grandmother   . Heart defect Brother        no CAD, open heart surgery as an adult  . Prostate cancer Neg Hx      Allergies as of 11/28/2016   No Known Allergies     Medication List       Accurate as of 11/28/16 11:59 PM. Always use your most recent med list.          ALPRAZolam 0.5 MG tablet Commonly known as:  XANAX Take 1-2 tablets (0.5-1  mg total) by mouth at bedtime as needed for sleep.   atorvastatin 80 MG tablet Commonly known as:  LIPITOR Take 1 tablet (80 mg total) by mouth daily.   FLUoxetine 20 MG tablet Commonly known as:  PROZAC Take 1 tablet (20 mg total) by mouth daily.   MELATONIN GUMMIES PO Take 2 tablets by mouth at bedtime.   multivitamin tablet Take 1 tablet by mouth daily.   ZYRTEC ALLERGY PO Take by mouth.            Discharge Care Instructions        Start     Ordered   11/28/16 0000  EKG 12-Lead     11/28/16 0938   11/28/16 0000  Comp Met (CMET)     11/28/16 0951   11/28/16 0000  Lipid panel     11/28/16 0951   11/28/16 0000  Hemoglobin A1c     11/28/16 0951   11/28/16 0000  TSH     11/28/16 0017  11/28/16 0000  Fecal occult blood, imunochemical     11/28/16 0951   11/28/16 0000  Ambulatory referral to Dermatology    Comments:  Pt prefers dermatologist in [facility]   11/28/16 5093         Objective:   Physical Exam BP 126/80 (BP Location: Left Arm, Patient Position: Sitting, Cuff Size: Normal)   Pulse 81   Temp 98 F (36.7 C) (Oral)   Resp 14   Ht _0  (1.88 m)   Wt 201 lb 4 oz (91.3 kg)   SpO2 98%   BMI 25.84 kg/m   General:   Well developed, well nourished . NAD.  Neck: No  thyromegaly  HEENT:  Normocephalic . Face symmetric, atraumatic Lungs:  CTA B Normal respiratory effort, no intercostal retractions, no accessory muscle use. Heart: RRR,  no murmur.  No pretibial edema bilaterally  Abdomen:  Not distended, soft, non-tender. No rebound or rigidity.   Skin: Exposed areas without rash. Not pale. Not jaundice Neurologic:  alert & oriented X3.  Speech normal, gait appropriate for age and unassisted Strength symmetric and appropriate for age.  Psych: Cognition and judgment appear intact.  Cooperative with normal attention span and concentration.  Behavior appropriate. No anxious or depressed appearing.    Assessment & Plan:   Assessment   (restablished 01-2016) anxiety depression (divorce related in the past, sx resurface ~01-2016 work related) Insomnia Hyperlipidemia  PLAN:  Anxiety depression: Currently well controlled on fluoxetine and Xanax Hyperlipidemia: On Lipitor, rechecking labs Weight loss:  She is somewhat concerned about weight loss however he change his diet a year ago, eating healthier, otherwise he feels well and physical exam is normal. I don't believe weight loss is due to a illness, his BMI 25, recommend observation. Addendum: Request a dermatology referral skin lesion L ear-chest, will do  RTC 6 months

## 2016-11-28 NOTE — Progress Notes (Signed)
Pre visit review using our clinic review tool, if applicable. No additional management support is needed unless otherwise documented below in the visit note. 

## 2016-11-29 ENCOUNTER — Other Ambulatory Visit (INDEPENDENT_AMBULATORY_CARE_PROVIDER_SITE_OTHER): Payer: Managed Care, Other (non HMO)

## 2016-11-29 DIAGNOSIS — Z0000 Encounter for general adult medical examination without abnormal findings: Secondary | ICD-10-CM | POA: Diagnosis not present

## 2016-11-29 DIAGNOSIS — E875 Hyperkalemia: Secondary | ICD-10-CM

## 2016-11-29 LAB — LIPID PANEL
CHOLESTEROL: 162 mg/dL (ref 0–200)
HDL: 43.6 mg/dL (ref >39.00)
LDL CALC: 109 mg/dL — AB (ref 0–99)
NonHDL: 118.79
TRIGLYCERIDES: 51 mg/dL (ref 0.0–149.0)
Total CHOL/HDL Ratio: 4
VLDL: 10.2 mg/dL (ref 0.0–40.0)

## 2016-11-29 LAB — COMPREHENSIVE METABOLIC PANEL
ALBUMIN: 4.4 g/dL (ref 3.5–5.2)
ALT: 23 U/L (ref 0–53)
AST: 19 U/L (ref 0–37)
Alkaline Phosphatase: 45 U/L (ref 39–117)
BUN: 15 mg/dL (ref 6–23)
CALCIUM: 9.9 mg/dL (ref 8.4–10.5)
CHLORIDE: 106 meq/L (ref 96–112)
CO2: 32 mEq/L (ref 19–32)
CREATININE: 1.08 mg/dL (ref 0.40–1.50)
GFR: 77.23 mL/min (ref >60.00)
Glucose, Bld: 103 mg/dL — ABNORMAL HIGH (ref 70–99)
POTASSIUM: 5.8 meq/L — AB (ref 3.5–5.1)
Sodium: 144 mEq/L (ref 135–145)
Total Bilirubin: 0.6 mg/dL (ref 0.2–1.2)
Total Protein: 6.9 g/dL (ref 6.0–8.3)

## 2016-11-29 LAB — TSH: TSH: 1.22 u[IU]/mL (ref 0.35–4.50)

## 2016-11-29 LAB — HEMOGLOBIN A1C: Hgb A1c MFr Bld: 5.6 % (ref 4.6–6.5)

## 2016-11-29 NOTE — Assessment & Plan Note (Signed)
Anxiety depression: Currently well controlled on fluoxetine and Xanax Hyperlipidemia: On Lipitor, rechecking labs Weight loss:  She is somewhat concerned about weight loss however he change his diet a year ago, eating healthier, otherwise he feels well and physical exam is normal. I don't believe weight loss is due to a illness, his BMI 25, recommend observation. Addendum: Request a dermatology referral skin lesion L ear-chest, will do  RTC 6 months

## 2016-11-29 NOTE — Addendum Note (Signed)
Addended byDamita Dunnings D on: 11/29/2016 12:59 PM   Modules accepted: Orders

## 2017-01-23 ENCOUNTER — Telehealth: Payer: Self-pay | Admitting: *Deleted

## 2017-01-23 NOTE — Telephone Encounter (Signed)
Received letter stating that [facility] have been unable to reach patient for New Patient appointment; forwarded to provider/SLS /

## 2017-02-12 ENCOUNTER — Other Ambulatory Visit: Payer: Self-pay | Admitting: Internal Medicine

## 2017-02-12 NOTE — Telephone Encounter (Signed)
Okay #60 and 2 refills 

## 2017-02-12 NOTE — Telephone Encounter (Signed)
Pt is requesting refill on alprazolam 0.5mg .  Last OV: 11/28/2016 Last Fill: 10/02/2016 #60 and 2RF UDS: 07/18/2016 Low risk  NCCR printed; no issues noted  Please advise.

## 2017-02-12 NOTE — Telephone Encounter (Signed)
[facility]  

## 2017-02-12 NOTE — Telephone Encounter (Signed)
Rx printed, awaiting MD signature.  

## 2017-02-18 ENCOUNTER — Telehealth: Payer: Self-pay

## 2017-02-18 MED ORDER — FLUOXETINE HCL 20 MG PO CAPS: 20.0000 mg | ORAL_CAPSULE | Freq: Every day | ORAL | 0 refills | Status: DC

## 2017-02-18 NOTE — Telephone Encounter (Signed)
Okay to switch to capsule. Also, needs a follow-up BMP due to high potassium.  Please arrange

## 2017-02-18 NOTE — Telephone Encounter (Signed)
Copied from [place] [id number]. Topic: Inquiry >> Feb 18, 2017  3:28 PM Damita Dunnings, CMA wrote: Reason for CRM: Received fax from [place] requesting to switch fluoxetine 20mg  tablets to capsule form as it is cheaper for the Pt. Please advise if okay to do so.

## 2017-02-18 NOTE — Telephone Encounter (Signed)
Fluoxetine 20mg  capsules sent to [facility] in [place]. MyChart message sent to Pt regarding BMP.

## 2017-03-17 ENCOUNTER — Other Ambulatory Visit: Payer: Self-pay | Admitting: Internal Medicine

## 2017-05-26 ENCOUNTER — Other Ambulatory Visit: Payer: Self-pay | Admitting: Internal Medicine

## 2017-05-27 ENCOUNTER — Telehealth: Payer: Self-pay | Admitting: Internal Medicine

## 2017-05-27 NOTE — Telephone Encounter (Signed)
Please advse 

## 2017-05-27 NOTE — Telephone Encounter (Signed)
Copied from [place] [id number]. Topic: Quick Communication - See Telephone Encounter >> May 27, 2017  5:53 PM Neva Seat wrote: Pt has a 6 month f/u w/ Dr Larose Kells on Mon 06-02-17. Pt wants to know if he will need to fast if there is blood work needing to be done on him at this time? Please call pt asap to discuss.

## 2017-05-28 NOTE — Telephone Encounter (Signed)
he is over due for a BMP, that is what I am planning to check.  No need to be fasting for that particular test.

## 2017-05-28 NOTE — Telephone Encounter (Signed)
Spoke w/ Pt, informed him no fasting necessary. Pt verbalized understanding.

## 2017-05-30 ENCOUNTER — Ambulatory Visit: Payer: Managed Care, Other (non HMO) | Admitting: Internal Medicine

## 2017-06-02 ENCOUNTER — Ambulatory Visit: Payer: Managed Care, Other (non HMO) | Admitting: Internal Medicine

## 2017-06-02 ENCOUNTER — Encounter: Payer: Self-pay | Admitting: Internal Medicine

## 2017-06-02 VITALS — BP 124/78 | HR 98 | Temp 98.2°F | Resp 14 | Ht 74.0 in | Wt 215.1 lb

## 2017-06-02 DIAGNOSIS — F5101 Primary insomnia: Secondary | ICD-10-CM

## 2017-06-02 DIAGNOSIS — F341 Dysthymic disorder: Secondary | ICD-10-CM | POA: Diagnosis not present

## 2017-06-02 DIAGNOSIS — R6882 Decreased libido: Secondary | ICD-10-CM | POA: Diagnosis not present

## 2017-06-02 DIAGNOSIS — Z79899 Other long term (current) drug therapy: Secondary | ICD-10-CM

## 2017-06-02 DIAGNOSIS — E875 Hyperkalemia: Secondary | ICD-10-CM | POA: Diagnosis not present

## 2017-06-02 MED ORDER — ALPRAZOLAM 0.5 MG PO TABS: 0.5000 mg | ORAL_TABLET | Freq: Every evening | ORAL | 2 refills | Status: DC | PRN

## 2017-06-02 MED ORDER — FLUOXETINE HCL 10 MG PO CAPS: 10.0000 mg | ORAL_CAPSULE | Freq: Every day | ORAL | 6 refills | Status: DC

## 2017-06-02 NOTE — Progress Notes (Signed)
Subjective:    Patient ID: Michael Berg, male    DOB: 05-01-1965, 53 y.o.   MRN: [id number]  DOS:  06/02/2017 Type of visit - description : rov Interval history: Overall feels great. Anxiety: Well controlled, not taking fluoxetine daily  Wt Readings from Last 3 Encounters:  06/02/17 215 lb 2 oz (97.6 kg)  11/28/16 201 lb 4 oz (91.3 kg)  07/16/16 210 lb (95.3 kg)     Review of Systems Reports low back pain for the last week, initially 9/10, now the pain is much improved to 3/10.  No radiation. No chest pain or palpitations  Past Medical History:  Diagnosis Date  . Allergy   . Anxiety and depression    Hx of anxiety and Depression due to divorce. Not experiencing any concerns at this moment.  . Dyslipidemia   . Insomnia     Past Surgical History:  Procedure Laterality Date  . APPENDECTOMY     age 89    Social History   Socioeconomic History  . Marital status: Divorced    Spouse name: Not on file  . Number of children: 2  . Years of education: Not on file  . Highest education level: Not on file  Social Needs  . Financial resource strain: Not on file  . Food insecurity - worry: Not on file  . Food insecurity - inability: Not on file  . Transportation needs - medical: Not on file  . Transportation needs - non-medical: Not on file  Occupational History  . 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 Smoking status: Former Research scientist (life sciences)  . Smokeless tobacco: Never Used  Substance and Sexual Activity  . Alcohol use: Yes    Comment: socially  . Drug use: No  . Sexual activity: Yes  Other Topics Concern  . Not on file  Social History Narrative   Household- pt and youngest child      Allergies as of 06/02/2017   No Known Allergies     Medication List        Accurate as of 06/02/17 11:59 PM. Always use your most recent med list.          ALPRAZolam 0.5 MG tablet Commonly known as:  XANAX Take 1-2 tablets  (0.5-1 mg total) by mouth at bedtime as needed for sleep.   atorvastatin 80 MG tablet Commonly known as:  LIPITOR Take 1 tablet (80 mg total) by mouth daily.   FLUoxetine 10 MG capsule Commonly known as:  PROZAC Take 1 capsule (10 mg total) by mouth daily.   MELATONIN GUMMIES PO Take 2 tablets by mouth at bedtime.   multivitamin tablet Take 1 tablet by mouth daily.   ZYRTEC ALLERGY PO Take by mouth.          Objective:   Physical Exam BP 124/78 (BP Location: Left Arm, Patient Position: Sitting, Cuff Size: Normal)   Pulse 98   Temp 98.2 F (36.8 C) (Oral)   Resp 14   Ht 6\' 2"  (1.88 m)   Wt 215 lb 2 oz (97.6 kg)   SpO2 95%   BMI 27.62 kg/m  General:   Well developed, well nourished . NAD.  HEENT:  Normocephalic . Face symmetric, atraumatic Lungs:  CTA B Normal respiratory effort, no intercostal retractions, no accessory muscle use. Heart: RRR,  no murmur.  No pretibial edema bilaterally  Skin: Not pale. Not jaundice Neurologic:  alert & oriented X3.  Speech  normal, gait appropriate for age and unassisted Psych--  Cognition and judgment appear intact.  Cooperative with normal attention span and concentration.  Behavior appropriate. No anxious or depressed appearing.      Assessment & Plan:   Assessment  (restablished 01-2016) anxiety depression (divorce related in the past, sx resurface ~01-2016 work related) Insomnia Hyperlipidemia  PLAN:  Anxiety, depression: Takes Xanax either  0, 1, 2 tablets as needed.  UDS and contract today.  RF as needed Take fluoxetine "as needed".  Recommend to take daily, we agreed to decrease the dose to 10 mg daily. Insomnia: Well controlled with melatonin Hyperkalemia: See last labs, he feels well, not taking any salt substitutes.  Recheck a BMP. Skin lesion: see last OV, was seen by dermatology. Decreased energy, decreased libido: sx on and off, request a testosterone check.  Will do RTC 6 months, CPX

## 2017-06-02 NOTE — Progress Notes (Signed)
Pre visit review using our clinic review tool, if applicable. No additional management support is needed unless otherwise documented below in the visit note. 

## 2017-06-02 NOTE — Patient Instructions (Addendum)
Schedule labs to be done in the morning, fasting.   Schedule your next appointment for a physical exam   6 months

## 2017-06-03 NOTE — Assessment & Plan Note (Signed)
Anxiety, depression: Takes Xanax either  0, 1, 2 tablets as needed.  UDS and contract today.  RF as needed Take fluoxetine "as needed".  Recommend to take daily, we agreed to decrease the dose to 10 mg daily. Insomnia: Well controlled with melatonin Hyperkalemia: See last labs, he feels well, not taking any salt substitutes.  Recheck a BMP. Skin lesion: see last OV, was seen by dermatology. Decreased energy, decreased libido: sx on and off, request a testosterone check.  Will do RTC 6 months, CPX

## 2017-06-04 ENCOUNTER — Other Ambulatory Visit (INDEPENDENT_AMBULATORY_CARE_PROVIDER_SITE_OTHER): Payer: Managed Care, Other (non HMO)

## 2017-06-04 DIAGNOSIS — F341 Dysthymic disorder: Secondary | ICD-10-CM | POA: Diagnosis not present

## 2017-06-04 DIAGNOSIS — F5101 Primary insomnia: Secondary | ICD-10-CM

## 2017-06-04 DIAGNOSIS — E875 Hyperkalemia: Secondary | ICD-10-CM

## 2017-06-04 DIAGNOSIS — R6882 Decreased libido: Secondary | ICD-10-CM

## 2017-06-04 DIAGNOSIS — Z79899 Other long term (current) drug therapy: Secondary | ICD-10-CM

## 2017-06-04 LAB — BASIC METABOLIC PANEL
BUN: 18 mg/dL (ref 6–23)
CHLORIDE: 104 meq/L (ref 96–112)
CO2: 31 mEq/L (ref 19–32)
Calcium: 9.7 mg/dL (ref 8.4–10.5)
Creatinine, Ser: 0.86 mg/dL (ref 0.40–1.50)
GFR: 100.23 mL/min (ref >60.00)
GLUCOSE: 103 mg/dL — AB (ref 70–99)
Potassium: 4.3 mEq/L (ref 3.5–5.1)
SODIUM: 142 meq/L (ref 135–145)

## 2017-06-09 LAB — PAIN MGMT, PROFILE 8 W/CONF, U
6 Acetylmorphine: NEGATIVE ng/mL (ref <10)
ALCOHOL METABOLITES: POSITIVE ng/mL — AB (ref <500)
ALPHAHYDROXYMIDAZOLAM: NEGATIVE ng/mL (ref <50)
Alphahydroxyalprazolam: 43 ng/mL — ABNORMAL HIGH (ref <25)
Alphahydroxytriazolam: NEGATIVE ng/mL (ref <50)
Aminoclonazepam: NEGATIVE ng/mL (ref <25)
Amphetamines: NEGATIVE ng/mL (ref <500)
BENZODIAZEPINES: POSITIVE ng/mL — AB (ref <100)
Buprenorphine, Urine: NEGATIVE ng/mL (ref <5)
COCAINE METABOLITE: NEGATIVE ng/mL (ref <150)
Creatinine: 100.9 mg/dL
ETHYL GLUCURONIDE (ETG): 38515 ng/mL — AB (ref <500)
Ethyl Sulfate (ETS): 5874 ng/mL — ABNORMAL HIGH (ref <100)
HYDROXYETHYLFLURAZEPAM: NEGATIVE ng/mL (ref <50)
LORAZEPAM: NEGATIVE ng/mL (ref <50)
MARIJUANA METABOLITE: NEGATIVE ng/mL (ref <20)
MDMA: NEGATIVE ng/mL (ref <500)
Nordiazepam: NEGATIVE ng/mL (ref <50)
Opiates: NEGATIVE ng/mL (ref <100)
Oxazepam: NEGATIVE ng/mL (ref <50)
Oxidant: NEGATIVE ug/mL (ref <200)
Oxycodone: NEGATIVE ng/mL (ref <100)
Temazepam: NEGATIVE ng/mL (ref <50)
pH: 6.59 (ref 4.5–9.0)

## 2017-06-09 LAB — TESTOSTERONE TOTAL,FREE,BIO, MALES
Albumin: 4.2 g/dL (ref 3.6–5.1)
SEX HORMONE BINDING: 26 nmol/L (ref 10–50)
TESTOSTERONE BIOAVAILABLE: 112.5 ng/dL (ref >=110.0)
TESTOSTERONE FREE: 58.4 pg/mL (ref 46.0–224.0)
Testosterone: 365 ng/dL (ref 250–827)

## 2017-09-26 ENCOUNTER — Other Ambulatory Visit: Payer: Self-pay | Admitting: Internal Medicine

## 2017-11-21 ENCOUNTER — Other Ambulatory Visit: Payer: Self-pay | Admitting: Internal Medicine

## 2017-12-01 ENCOUNTER — Encounter: Payer: Self-pay | Admitting: Internal Medicine

## 2017-12-01 ENCOUNTER — Other Ambulatory Visit: Payer: Self-pay | Admitting: Internal Medicine

## 2017-12-01 ENCOUNTER — Ambulatory Visit (INDEPENDENT_AMBULATORY_CARE_PROVIDER_SITE_OTHER): Payer: Managed Care, Other (non HMO) | Admitting: Internal Medicine

## 2017-12-01 VITALS — BP 120/74 | HR 73 | Temp 98.5°F | Resp 14 | Ht 74.0 in | Wt 216.5 lb

## 2017-12-01 DIAGNOSIS — Z0000 Encounter for general adult medical examination without abnormal findings: Secondary | ICD-10-CM

## 2017-12-01 LAB — LIPID PANEL
CHOL/HDL RATIO: 4
CHOLESTEROL: 172 mg/dL (ref 0–200)
HDL: 48.4 mg/dL (ref >39.00)
LDL CALC: 108 mg/dL — AB (ref 0–99)
NonHDL: 123.25
TRIGLYCERIDES: 74 mg/dL (ref 0.0–149.0)
VLDL: 14.8 mg/dL (ref 0.0–40.0)

## 2017-12-01 LAB — COMPREHENSIVE METABOLIC PANEL
ALK PHOS: 50 U/L (ref 39–117)
ALT: 27 U/L (ref 0–53)
AST: 25 U/L (ref 0–37)
Albumin: 4.6 g/dL (ref 3.5–5.2)
BUN: 16 mg/dL (ref 6–23)
CALCIUM: 9.7 mg/dL (ref 8.4–10.5)
CHLORIDE: 102 meq/L (ref 96–112)
CO2: 31 mEq/L (ref 19–32)
CREATININE: 1.05 mg/dL (ref 0.40–1.50)
GFR: 79.45 mL/min (ref >60.00)
Glucose, Bld: 100 mg/dL — ABNORMAL HIGH (ref 70–99)
Potassium: 4.9 mEq/L (ref 3.5–5.1)
Sodium: 137 mEq/L (ref 135–145)
Total Bilirubin: 1 mg/dL (ref 0.2–1.2)
Total Protein: 7.1 g/dL (ref 6.0–8.3)

## 2017-12-01 LAB — CBC WITH DIFFERENTIAL/PLATELET
BASOS ABS: 0 10*3/uL (ref 0.0–0.1)
Basophils Relative: 0.6 % (ref 0.0–3.0)
EOS ABS: 0.1 10*3/uL (ref 0.0–0.7)
Eosinophils Relative: 1.6 % (ref 0.0–5.0)
HEMATOCRIT: 45.6 % (ref 39.0–52.0)
HEMOGLOBIN: 15 g/dL (ref 13.0–17.0)
LYMPHS PCT: 24.4 % (ref 12.0–46.0)
Lymphs Abs: 1 10*3/uL (ref 0.7–4.0)
MCHC: 33 g/dL (ref 30.0–36.0)
MCV: 88.1 fl (ref 78.0–100.0)
MONO ABS: 0.3 10*3/uL (ref 0.1–1.0)
Monocytes Relative: 8.6 % (ref 3.0–12.0)
Neutro Abs: 2.6 10*3/uL (ref 1.4–7.7)
Neutrophils Relative %: 64.8 % (ref 43.0–77.0)
Platelets: 268 10*3/uL (ref 150.0–400.0)
RBC: 5.18 Mil/uL (ref 4.22–5.81)
RDW: 13 % (ref 11.5–15.5)
WBC: 4 10*3/uL (ref 4.0–10.5)

## 2017-12-01 LAB — PSA: PSA: 3.03 ng/mL (ref 0.10–4.00)

## 2017-12-01 LAB — HEMOGLOBIN A1C: Hgb A1c MFr Bld: 5.6 % (ref 4.6–6.5)

## 2017-12-01 MED ORDER — FLUOXETINE HCL 20 MG PO CAPS: 20.0000 mg | ORAL_CAPSULE | Freq: Every day | ORAL | 6 refills | Status: DC

## 2017-12-01 MED ORDER — FLUOXETINE HCL 20 MG PO TABS: 20.0000 mg | ORAL_TABLET | Freq: Every day | ORAL | 6 refills | Status: DC

## 2017-12-01 NOTE — Assessment & Plan Note (Signed)
Here for CPX Anxiety, depression, insomnia: On Xanax 1 or 2 at night, he self increase fluoxetine to 20 mg again, doing well. UDS was extremely expensive the last time we checked, will keep that in mind. Lack of her energy, decreased libido: Resolved, see last visit, his testosterone was normal. RTC 6 months

## 2017-12-01 NOTE — Patient Instructions (Signed)
GO TO THE LAB : Get the blood work     GO TO THE FRONT DESK Schedule your next appointment for a  Check up in 6 months   

## 2017-12-01 NOTE — Progress Notes (Signed)
Pre visit review using our clinic review tool, if applicable. No additional management support is needed unless otherwise documented below in the visit note. 

## 2017-12-01 NOTE — Assessment & Plan Note (Addendum)
-  Td 2012 --CCS: never had a cscope, 3 modalities discussed again today. For now elected an IFOB -prostate ca screening : DRE slightly abnormal, see physical exam.  Check a PSA. -Diet and exercise discussed -Labs: CMP, FLP, CBC A1C, PSA.

## 2017-12-01 NOTE — Progress Notes (Signed)
Subjective:    Patient ID: Michael Berg, male    DOB: May 04, 1965, 52 y.o.   MRN: [id number]  DOS:  12/01/2017 Type of visit - description : cpx Interval history:  doing well   Review of Systems  A 14 point review of systems is negative    Past Medical History:  Diagnosis Date  . Allergy   . Anxiety and depression    Hx of anxiety and Depression due to divorce. Not experiencing any concerns at this moment.  . Dyslipidemia   . Insomnia     Past Surgical History:  Procedure Laterality Date  . APPENDECTOMY     age 24    Social History   Socioeconomic History  . Marital status: Divorced    Spouse name: Not on file  . Number of children: 2  . Years of education: Not on file  . Highest education level: Not on file  Occupational History  . 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 Financial resource strain: Not on file  . Food insecurity:    Worry: Not on file    Inability: Not on file  . Transportation needs:    Medical: Not on file    Non-medical: Not on file  Tobacco Use  . Smoking status: Former Research scientist (life sciences)  . Smokeless tobacco: Never Used  Substance and Sexual Activity  . Alcohol use: Yes    Comment: socially  . Drug use: No  . Sexual activity: Yes  Lifestyle  . Physical activity:    Days per week: Not on file    Minutes per session: Not on file  . Stress: Not on file  Relationships  . Social connections:    Talks on phone: Not on file    Gets together: Not on file    Attends religious service: Not on file    Active member of club or organization: Not on file    Attends meetings of clubs or organizations: Not on file    Relationship status: Not on file  . Intimate partner violence:    Fear of current or ex partner: Not on file    Emotionally abused: Not on file    Physically abused: Not on file    Forced sexual activity: Not on file  Other Topics Concern  . Not on file  Social History Narrative   Household- pt and youngest child     Family History  Problem Relation Age of Onset  . Coronary artery disease Father        CABG at age 48  . Colon cancer Unknown        uncle dx at age 9 (?)  . Diabetes Paternal Grandmother   . Heart defect Brother        no CAD, open heart surgery as an adult  . Prostate cancer Neg Hx      Allergies as of 12/01/2017   No Known Allergies     Medication List        Accurate as of 12/01/17  8:27 PM. Always use your most recent med list.          ALPRAZolam 0.5 MG tablet Commonly known as:  XANAX Take 1-2 tablets (0.5-1 mg total) by mouth at bedtime as needed for sleep.   atorvastatin 80 MG tablet Commonly known as:  LIPITOR TAKE 1 TABLET (80 MG TOTAL) BY MOUTH DAILY.   FLUoxetine 20 MG capsule Commonly known as:  PROZAC  Take 1 capsule (20 mg total) by mouth daily.   MELATONIN GUMMIES PO Take 2 tablets by mouth at bedtime.   multivitamin tablet Take 1 tablet by mouth daily.   ZYRTEC ALLERGY PO Take by mouth.          Objective:   Physical Exam BP 120/74 (BP Location: Left Arm, Patient Position: Sitting, Cuff Size: Small)   Pulse 73   Temp 98.5 F (36.9 C) (Oral)   Resp 14   Ht 6\' 2"  (1.88 m)   Wt 216 lb 8 oz (98.2 kg)   SpO2 98%   BMI 27.80 kg/m  General: Well developed, NAD, see BMI.  Neck: No  thyromegaly  HEENT:  Normocephalic . Face symmetric, atraumatic Lungs:  CTA B Normal respiratory effort, no intercostal retractions, no accessory muscle use. Heart: RRR,  no murmur.  No pretibial edema bilaterally  Abdomen:  Not distended, soft, non-tender. No rebound or rigidity.   Skin: Exposed areas without rash. Not pale. Not jaundice Rectal: External abnormalities: none. Normal sphincter tone. No rectal masses or tenderness.  Brown stools Prostate: Prostate gland firm and smooth, slightly enlarged?Marland Kitchen  Not tender. Question of tiny nodule, at the proximal left side.  2- 3 mm  Neurologic:  alert & oriented X3.    Speech normal, gait appropriate for age and unassisted Strength symmetric and appropriate for age.  Psych: Cognition and judgment appear intact.  Cooperative with normal attention span and concentration.  Behavior appropriate. No anxious or depressed appearing.     Assessment & Plan:   Assessment  (restablished 01-2016) anxiety depression (divorce related in the past, sx resurface ~01-2016 work related) Insomnia Hyperlipidemia  PLAN:  Here for CPX Anxiety, depression, insomnia: On Xanax 1 or 2 at night, he self increase fluoxetine to 20 mg again, doing well. UDS was extremely expensive the last time we checked, will keep that in mind. Lack of her energy, decreased libido: Resolved, see last visit, his testosterone was normal. RTC 6 months

## 2018-01-10 ENCOUNTER — Telehealth: Payer: Self-pay | Admitting: Internal Medicine

## 2018-01-12 NOTE — Telephone Encounter (Signed)
Pt is requesting refill on alprazolam.   Last OV: 12/01/2017  Last Fill: 06/02/2017 #60 and 2RF UDS: 06/04/2017 Low risk  NCCR printed- no discrepancies noted- sent for scanning

## 2018-01-12 NOTE — Telephone Encounter (Signed)
Sent!

## 2018-03-03 ENCOUNTER — Other Ambulatory Visit: Payer: Self-pay | Admitting: Internal Medicine

## 2018-04-21 ENCOUNTER — Ambulatory Visit: Payer: Managed Care, Other (non HMO) | Admitting: Internal Medicine

## 2018-04-21 ENCOUNTER — Encounter: Payer: Self-pay | Admitting: Internal Medicine

## 2018-04-21 VITALS — BP 116/80 | HR 72 | Temp 98.2°F | Resp 16 | Ht 74.0 in | Wt 221.4 lb

## 2018-04-21 DIAGNOSIS — M7712 Lateral epicondylitis, left elbow: Secondary | ICD-10-CM

## 2018-04-21 DIAGNOSIS — M779 Enthesopathy, unspecified: Secondary | ICD-10-CM | POA: Diagnosis not present

## 2018-04-21 DIAGNOSIS — M778 Other enthesopathies, not elsewhere classified: Secondary | ICD-10-CM

## 2018-04-21 MED ORDER — MELOXICAM 15 MG PO TABS: 15.0000 mg | ORAL_TABLET | Freq: Every day | ORAL | 0 refills | Status: DC

## 2018-04-21 NOTE — Progress Notes (Signed)
Pre visit review using our clinic review tool, if applicable. No additional management support is needed unless otherwise documented below in the visit note. 

## 2018-04-21 NOTE — Progress Notes (Signed)
Subjective:    Patient ID: Michael Berg, male    DOB: January 04, 1966, 53 y.o.   MRN: [id number]  DOS:  04/21/2018 Type of visit - description: Acute visit Symptoms started 6 months ago, having pain at the proximal forearm, both at the lateral epicondyle area and also at the flexure aspect  of the elbow. Pain is worse with rotation of the forearm. He does feel a "pop" near the elbow.  Review of Systems  Denies any injury or overuse. 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  Past Medical History:  Diagnosis Date  . Allergy   . Anxiety and depression    Hx of anxiety and Depression due to divorce. Not experiencing any concerns at this moment.  . Dyslipidemia   . Insomnia     Past Surgical History:  Procedure Laterality Date  . APPENDECTOMY     age 12    Social History   Socioeconomic History  . Marital status: Divorced    Spouse name: Not on file  . Number of children: 2  . Years of education: Not on file  . Highest education level: Not on file  Occupational History  . 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 Financial resource strain: Not on file  . Food insecurity:    Worry: Not on file    Inability: Not on file  . Transportation needs:    Medical: Not on file    Non-medical: Not on file  Tobacco Use  . Smoking status: Former Research scientist (life sciences)  . Smokeless tobacco: Never Used  Substance and Sexual Activity  . Alcohol use: Yes    Comment: socially  . Drug use: No  . Sexual activity: Yes  Lifestyle  . Physical activity:    Days per week: Not on file    Minutes per session: Not on file  . Stress: Not on file  Relationships  . Social connections:    Talks on phone: Not on file    Gets together: Not on file    Attends religious service: Not on file    Active member of club or organization: Not on file    Attends meetings of clubs or organizations: Not on file    Relationship status: Not on  file  . Intimate partner violence:    Fear of current or ex partner: Not on file    Emotionally abused: Not on file    Physically abused: Not on file    Forced sexual activity: Not on file  Other Topics Concern  . Not on file  Social History Narrative   Household- pt and youngest child      Allergies as of 04/21/2018   No Known Allergies     Medication List       Accurate as of April 21, 2018  2:24 PM. Always use your most recent med list.        ALPRAZolam 0.5 MG tablet Commonly known as:  XANAX TAKE 1-2 TABLETS BY MOUTH AT BEDTIME AS NEEDED FOR SLEEP   atorvastatin 80 MG tablet Commonly known as:  LIPITOR TAKE 1 TABLET (80 MG TOTAL) BY MOUTH DAILY.   FLUoxetine 20 MG capsule Commonly known as:  PROZAC Take 1 capsule (20 mg total) by mouth daily.   MELATONIN GUMMIES PO Take 2 tablets by mouth at bedtime.   multivitamin tablet Take 1 tablet by mouth daily.   ZYRTEC ALLERGY PO Take  by mouth.           Objective:   Physical Exam Musculoskeletal:       Arms:    BP 116/80 (BP Location: Right Arm, Patient Position: Sitting, Cuff Size: Normal)   Pulse 72   Temp 98.2 F (36.8 C) (Oral)   Resp 16   Ht 6\' 2"  (1.88 m)   Wt 221 lb 6 oz (100.4 kg)   SpO2 97%   BMI 28.42 kg/m   General:   Well developed, NAD, BMI noted. HEENT:  Normocephalic . Face symmetric, atraumatic MSK: Right arm and elbow normal Left arm and elbow, elbow itself is normal to inspection and palpation.  Not tender at the lateral epicondyle.  Slightly tender distal from the epicondyle, see graphic,  and the flexural aspect of the forearm. Skin: Not pale. Not jaundice Neurologic:  alert & oriented X3.  Speech normal, gait appropriate for age and unassisted Psych--  Cognition and judgment appear intact.  Cooperative with normal attention span and concentration.  Behavior appropriate. No anxious or depressed appearing.      Assessment     Assessment  (restablished  01-2016) anxiety depression (divorce related in the past, sx resurface ~01-2016 work related) Insomnia Hyperlipidemia  PLAN:  Forearm tendinitis This is going for 6 months, there is some swelling and "click" close to the epicondyle area. Recommend to see sports medicine. In the meantime we will do tennis elbow brace, meloxicam, see instructions.  GI precautions carefully discussed.

## 2018-04-21 NOTE — Patient Instructions (Signed)
Get over-the-counter tennis elbow brace, use it most of the day.  Okay to remove it at night  Take meloxicam 1 tablet daily for 7 to 10 days, then as needed. Always take it with food because may cause gastritis and ulcers.  If you notice nausea, stomach pain, change in the color of stools --->  Stop the medicine and let us know  Will arrange a referral

## 2018-04-22 NOTE — Assessment & Plan Note (Signed)
Forearm tendinitis This is going for 6 months, there is some swelling and "click" close to the epicondyle area. Recommend to see sports medicine. In the meantime we will do tennis elbow brace, meloxicam, see instructions.  GI precautions carefully discussed.

## 2018-04-27 ENCOUNTER — Ambulatory Visit: Payer: Managed Care, Other (non HMO) | Admitting: Family Medicine

## 2018-04-27 ENCOUNTER — Encounter: Payer: Self-pay | Admitting: Family Medicine

## 2018-04-27 VITALS — BP 145/87 | HR 71 | Ht 73.0 in | Wt 218.0 lb

## 2018-04-27 DIAGNOSIS — M25522 Pain in left elbow: Secondary | ICD-10-CM | POA: Diagnosis not present

## 2018-04-27 NOTE — Patient Instructions (Addendum)
Your exam and ultrasound are reassuring - no evidence of tendon tear. This is due to distal biceps tendinopathy, possible superimposed supinator spasms/strain. These are treated similarly. Compression sleeve is likely to be helpful during the day. Icing or heat 15 minutes at a time as needed. Meloxicam as prescribed by your family doctor. Start strengthening exercises - hammer rotations, biceps curls 3 sets of 10 once a day - see handout for additional exercises as well but make sure you do these every day. If not improving consider physical therapy with modalities as next step, possibly nitro patches. Follow up with me in 6 weeks for reevaluation.

## 2018-04-27 NOTE — Progress Notes (Signed)
PCP and consultation requested by: Colon Branch, MD  Subjective:   HPI: Patient is a 53 y.o. male here for left elbow pain.  Patient presents with 6 months of left elbow pain.  He reports his pain is being 4/10.  He localizes the pain to the anterior proximal forearm/elbow.  He denies any specific injury.  He notes pain with lifting objects.  Is improved with rest.  He was seen by his PCP who prescribed Mobic which has been somewhat helpful.  He was also recommended to try a counterforce brace but he has not brought this.  He denies any numbness or tingling distally in the hand.  His pain does occasionally radiate into the distal biceps.  Additionally, he notes a pattern of clicking at the lateral elbow with pronation and supination.  He denies any history of elbow injury.  He is right-handed.  He denies any localized swelling, erythema, or bruising.  No skin changes.  Past Medical History:  Diagnosis Date  . Allergy   . Anxiety and depression    Hx of anxiety and Depression due to divorce. Not experiencing any concerns at this moment.  . Dyslipidemia   . Insomnia     Current Outpatient Medications on File Prior to Visit  Medication Sig Dispense Refill  . ALPRAZolam (XANAX) 0.5 MG tablet TAKE 1-2 TABLETS BY MOUTH AT BEDTIME AS NEEDED FOR SLEEP 60 tablet 2  . atorvastatin (LIPITOR) 80 MG tablet TAKE 1 TABLET (80 MG TOTAL) BY MOUTH DAILY. 90 tablet 1  . Cetirizine HCl (ZYRTEC ALLERGY PO) Take by mouth.    Marland Kitchen FLUoxetine (PROZAC) 20 MG capsule Take 1 capsule (20 mg total) by mouth daily. 90 capsule 1  . MELATONIN GUMMIES PO Take 2 tablets by mouth at bedtime.    . meloxicam (MOBIC) 15 MG tablet Take 1 tablet (15 mg total) by mouth daily. 30 tablet 0  . Multiple Vitamin (MULTIVITAMIN) tablet Take 1 tablet by mouth daily.       No current facility-administered medications on file prior to visit.     Past Surgical History:  Procedure Laterality Date  . APPENDECTOMY     age 71    No Known  Allergies  Social History   Socioeconomic History  . Marital status: Divorced    Spouse name: Not on file  . Number of children: 2  . Years of education: Not on file  . Highest education level: Not on file  Occupational History  . 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 Financial resource strain: Not on file  . Food insecurity:    Worry: Not on file    Inability: Not on file  . Transportation needs:    Medical: Not on file    Non-medical: Not on file  Tobacco Use  . Smoking status: Former Research scientist (life sciences)  . Smokeless tobacco: Never Used  Substance and Sexual Activity  . Alcohol use: Yes    Comment: socially  . Drug use: No  . Sexual activity: Yes  Lifestyle  . Physical activity:    Days per week: Not on file    Minutes per session: Not on file  . Stress: Not on file  Relationships  . Social connections:    Talks on phone: Not on file    Gets together: Not on file    Attends religious service: Not on file    Active member of club or organization: Not on file  Attends meetings of clubs or organizations: Not on file    Relationship status: Not on file  . Intimate partner violence:    Fear of current or ex partner: Not on file    Emotionally abused: Not on file    Physically abused: Not on file    Forced sexual activity: Not on file  Other Topics Concern  . Not on file  Social History Narrative   Household- pt and youngest child    Family History  Problem Relation Age of Onset  . Coronary artery disease Father        CABG at age 71  . Colon cancer Other        uncle dx at age 31 (?)  . Diabetes Paternal Grandmother   . Heart defect Brother        no CAD, open heart surgery as an adult  . Prostate cancer Neg Hx     BP (!) 145/87   Pulse 71   Ht 6\' 1"  (1.854 m)   Wt 218 lb (98.9 kg)   BMI 28.76 kg/m   Review of Systems: See HPI above.     Objective:  Physical Exam:  Gen: awake, alert, NAD, comfortable in  exam room Pulm: breathing unlabored  Left Elbow: No obvious deformity, swelling, erythema Tenderness of the anterior proximal forearm over the radial head anteriorly no tenderness over the medial or lateral epicondyles.  No tenderness over the radial head laterally Full range of motion the elbow without pain.  Palpable clicking at the radial head with supination and pronation 5/5 strength.  Mild pain reproduced with resisted supination N/V intact distally  [facility] [place]: Limited ultrasound of the left elbow.  Distal biceps tendon visualized long its course to insertion on the radius.  There appears to be a small, focal area of tenosynovitis.  Otherwise the tendon appears normal without tears.  Radial head visualized.  No evidence of fracture.  No effusion.  Right elbow: No obvious deformity or swelling No tenderness over the medial lateral epicondyle 5/5 strength with full range of motion N/V intact distally   Assessment & Plan:  1.   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  No concern for distal biceps tear though very small amount of tenosynovitis distally - both treated similarly. - Continue Mobic as needed - Consider compression elbow sleeve - Home exercises for strengthening. Consider PT referral - Follow-up 6 weeks

## 2018-04-29 ENCOUNTER — Encounter: Payer: Self-pay | Admitting: Family Medicine

## 2018-04-29 NOTE — Addendum Note (Signed)
Addended by: Karlton Lemon R on: 04/29/2018 10:20 AM   Modules accepted: Level of Service

## 2018-05-16 ENCOUNTER — Other Ambulatory Visit: Payer: Self-pay | Admitting: Internal Medicine

## 2018-05-21 ENCOUNTER — Other Ambulatory Visit: Payer: Self-pay | Admitting: Internal Medicine

## 2018-06-05 ENCOUNTER — Ambulatory Visit: Payer: Managed Care, Other (non HMO) | Admitting: Internal Medicine

## 2018-06-12 ENCOUNTER — Ambulatory Visit: Payer: Managed Care, Other (non HMO) | Admitting: Family Medicine

## 2018-07-17 ENCOUNTER — Ambulatory Visit (INDEPENDENT_AMBULATORY_CARE_PROVIDER_SITE_OTHER): Payer: Managed Care, Other (non HMO) | Admitting: Internal Medicine

## 2018-07-17 ENCOUNTER — Other Ambulatory Visit: Payer: Self-pay

## 2018-07-17 ENCOUNTER — Other Ambulatory Visit: Payer: Self-pay | Admitting: Internal Medicine

## 2018-07-17 DIAGNOSIS — E785 Hyperlipidemia, unspecified: Secondary | ICD-10-CM

## 2018-07-17 DIAGNOSIS — M65222 Calcific tendinitis, left upper arm: Secondary | ICD-10-CM

## 2018-07-17 DIAGNOSIS — F341 Dysthymic disorder: Secondary | ICD-10-CM

## 2018-07-17 DIAGNOSIS — M778 Other enthesopathies, not elsewhere classified: Secondary | ICD-10-CM

## 2018-07-17 DIAGNOSIS — M779 Enthesopathy, unspecified: Secondary | ICD-10-CM

## 2018-07-17 MED ORDER — ALPRAZOLAM 0.5 MG PO TABS: 0.5000 mg | ORAL_TABLET | Freq: Every evening | ORAL | 2 refills | Status: DC | PRN

## 2018-07-17 MED ORDER — FLUOXETINE HCL 20 MG PO CAPS: 20.0000 mg | ORAL_CAPSULE | Freq: Every day | ORAL | 1 refills | Status: DC

## 2018-07-17 NOTE — Progress Notes (Signed)
Subjective:    Patient ID: Michael Berg, male    DOB: July 20, 1965, 53 y.o.   MRN: [id number]  DOS:  07/17/2018 Type of visit - description: Virtual Visit via Video Note  I connected with@ on 07/17/18 at  2:40 PM EDT by a video enabled telemedicine application and verified that I am speaking with the correct person using two identifiers.   THIS ENCOUNTER IS A VIRTUAL VISIT DUE TO COVID-19 - PATIENT WAS NOT SEEN IN THE OFFICE. PATIENT HAS CONSENTED TO VIRTUAL VISIT / TELEMEDICINE VISIT   Location of patient: home  Location of provider: office  I discussed the limitations of evaluation and management by telemedicine and the availability of in person appointments. The patient expressed understanding and agreed to proceed.  History of Present Illness: Follow-up Tendinitis: Ongoing problem, meloxicam did not help Anxiety depression: Under more stress than usual due to COVID-19, however overall he feels well emotionally.  His intake of Xanax has increased a little in the last few weeks. High cholesterol: Good med compliance. We reviewed together his medication list and blood work   Review of Systems Denies fever chills No chest pain no difficulty breathing No cough  Past Medical History:  Diagnosis Date  . Allergy   . Anxiety and depression    Hx of anxiety and Depression due to divorce. Not experiencing any concerns at this moment.  . Dyslipidemia   . Insomnia     Past Surgical History:  Procedure Laterality Date  . APPENDECTOMY     age 7    Social History   Socioeconomic History  . Marital status: Divorced    Spouse name: Not on file  . Number of children: 2  . Years of education: Not on file  . Highest education level: Not on file  Occupational History  . 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 Financial resource strain: Not on file  . Food insecurity:    Worry: Not on file    Inability: Not on file  .  Transportation needs:    Medical: Not on file    Non-medical: Not on file  Tobacco Use  . Smoking status: Former Research scientist (life sciences)  . Smokeless tobacco: Never Used  Substance and Sexual Activity  . Alcohol use: Yes    Comment: socially  . Drug use: No  . Sexual activity: Yes  Lifestyle  . Physical activity:    Days per week: Not on file    Minutes per session: Not on file  . Stress: Not on file  Relationships  . Social connections:    Talks on phone: Not on file    Gets together: Not on file    Attends religious service: Not on file    Active member of club or organization: Not on file    Attends meetings of clubs or organizations: Not on file    Relationship status: Not on file  . Intimate partner violence:    Fear of current or ex partner: Not on file    Emotionally abused: Not on file    Physically abused: Not on file    Forced sexual activity: Not on file  Other Topics Concern  . Not on file  Social History Narrative   Household- pt and youngest child      Allergies as of 07/17/2018   No Known Allergies     Medication List       Accurate as of July 17, 2018  2:40 PM. Always use your most recent med list.        ALPRAZolam 0.5 MG tablet Commonly known as:  XANAX TAKE 1-2 TABLETS BY MOUTH AT BEDTIME AS NEEDED FOR SLEEP   atorvastatin 80 MG tablet Commonly known as:  LIPITOR Take 1 tablet (80 mg total) by mouth daily.   FLUoxetine 20 MG capsule Commonly known as:  PROZAC Take 1 capsule (20 mg total) by mouth daily.   MELATONIN GUMMIES PO Take 2 tablets by mouth at bedtime.   meloxicam 15 MG tablet Commonly known as:  MOBIC Take 1 tablet (15 mg total) by mouth daily as needed for pain.   multivitamin tablet Take 1 tablet by mouth daily.   ZYRTEC ALLERGY PO Take by mouth.           Objective:   Physical Exam There were no vitals taken for this visit. This was a video visit, the patient is alert and oriented x3, no apparent distress, does not seem to  be anxious or depressed    Assessment    Assessment  (restablished 01-2016) anxiety depression (divorce related in the past, sx resurface ~01-2016 work related) Insomnia Hyperlipidemia  PLAN:  Left forearm tendinitis: Not better, still feels a "click", meloxicam did not help, reports that sports medicine did not help much.  Plan: Refer to upper extremity Ortho. Anxiety depression: Under more stress due to COVID-19, nevertheless symptoms are controlled.  Needs refills, they were sent. Hyperlipidemia: Last FLP satisfactory Plan: We will arrange CPX for September.  We will call the patient.    I discussed the assessment and treatment plan with the patient. The patient was provided an opportunity to ask questions and all were answered. The patient agreed with the plan and demonstrated an understanding of the instructions.   The patient was advised to call back or seek an in-person evaluation if the symptoms worsen or if the condition fails to improve as anticipated.

## 2018-07-17 NOTE — Assessment & Plan Note (Signed)
Left forearm tendinitis: Not better, still feels a "click", meloxicam did not help, reports that sports medicine did not help much.  Plan: Refer to upper extremity Ortho. Anxiety depression: Under more stress due to COVID-19, nevertheless symptoms are controlled.  Needs refills, they were sent. Hyperlipidemia: Last FLP satisfactory Plan: We will arrange CPX for September.  We will call the patient.

## 2018-07-20 ENCOUNTER — Telehealth: Payer: Self-pay | Admitting: Internal Medicine

## 2018-07-20 NOTE — Telephone Encounter (Signed)
Refill request for meloxicam 15mg - no longer on med list. Please advise.

## 2018-07-20 NOTE — Telephone Encounter (Signed)
RF sent, he takes it from time to time for tendinitis

## 2018-11-13 ENCOUNTER — Other Ambulatory Visit: Payer: Self-pay | Admitting: Internal Medicine

## 2018-11-18 ENCOUNTER — Other Ambulatory Visit: Payer: Self-pay | Admitting: Internal Medicine

## 2018-12-11 ENCOUNTER — Other Ambulatory Visit: Payer: Self-pay

## 2018-12-11 ENCOUNTER — Ambulatory Visit (INDEPENDENT_AMBULATORY_CARE_PROVIDER_SITE_OTHER): Payer: Managed Care, Other (non HMO) | Admitting: Internal Medicine

## 2018-12-11 ENCOUNTER — Encounter: Payer: Self-pay | Admitting: Internal Medicine

## 2018-12-11 ENCOUNTER — Telehealth: Payer: Self-pay

## 2018-12-11 VITALS — BP 126/72 | HR 76 | Temp 97.4°F | Resp 16 | Ht 73.0 in | Wt 221.5 lb

## 2018-12-11 DIAGNOSIS — Z79899 Other long term (current) drug therapy: Secondary | ICD-10-CM | POA: Diagnosis not present

## 2018-12-11 DIAGNOSIS — F341 Dysthymic disorder: Secondary | ICD-10-CM

## 2018-12-11 DIAGNOSIS — Z0000 Encounter for general adult medical examination without abnormal findings: Secondary | ICD-10-CM | POA: Diagnosis not present

## 2018-12-11 DIAGNOSIS — E785 Hyperlipidemia, unspecified: Secondary | ICD-10-CM

## 2018-12-11 DIAGNOSIS — Z23 Encounter for immunization: Secondary | ICD-10-CM | POA: Diagnosis not present

## 2018-12-11 DIAGNOSIS — Z125 Encounter for screening for malignant neoplasm of prostate: Secondary | ICD-10-CM

## 2018-12-11 LAB — COMPREHENSIVE METABOLIC PANEL
ALT: 35 U/L (ref 0–53)
AST: 27 U/L (ref 0–37)
Albumin: 4.6 g/dL (ref 3.5–5.2)
Alkaline Phosphatase: 55 U/L (ref 39–117)
BUN: 16 mg/dL (ref 6–23)
CO2: 33 mEq/L — ABNORMAL HIGH (ref 19–32)
Calcium: 9.9 mg/dL (ref 8.4–10.5)
Chloride: 103 mEq/L (ref 96–112)
Creatinine, Ser: 1.05 mg/dL (ref 0.40–1.50)
GFR: 74.44 mL/min (ref >60.00)
Glucose, Bld: 97 mg/dL (ref 70–99)
Potassium: 4.7 mEq/L (ref 3.5–5.1)
Sodium: 140 mEq/L (ref 135–145)
Total Bilirubin: 0.8 mg/dL (ref 0.2–1.2)
Total Protein: 7 g/dL (ref 6.0–8.3)

## 2018-12-11 LAB — CBC WITH DIFFERENTIAL/PLATELET
Basophils Absolute: 0 10*3/uL (ref 0.0–0.1)
Basophils Relative: 0.6 % (ref 0.0–3.0)
Eosinophils Absolute: 0.1 10*3/uL (ref 0.0–0.7)
Eosinophils Relative: 1.3 % (ref 0.0–5.0)
HCT: 44.4 % (ref 39.0–52.0)
Hemoglobin: 14.7 g/dL (ref 13.0–17.0)
Lymphocytes Relative: 28 % (ref 12.0–46.0)
Lymphs Abs: 1.2 10*3/uL (ref 0.7–4.0)
MCHC: 33.1 g/dL (ref 30.0–36.0)
MCV: 87.4 fl (ref 78.0–100.0)
Monocytes Absolute: 0.4 10*3/uL (ref 0.1–1.0)
Monocytes Relative: 8.4 % (ref 3.0–12.0)
Neutro Abs: 2.7 10*3/uL (ref 1.4–7.7)
Neutrophils Relative %: 61.7 % (ref 43.0–77.0)
Platelets: 272 10*3/uL (ref 150.0–400.0)
RBC: 5.08 Mil/uL (ref 4.22–5.81)
RDW: 13.1 % (ref 11.5–15.5)
WBC: 4.3 10*3/uL (ref 4.0–10.5)

## 2018-12-11 LAB — LIPID PANEL
Cholesterol: 175 mg/dL (ref 0–200)
HDL: 38 mg/dL — ABNORMAL LOW (ref >39.00)
LDL Cholesterol: 120 mg/dL — ABNORMAL HIGH (ref 0–99)
NonHDL: 136.89
Total CHOL/HDL Ratio: 5
Triglycerides: 85 mg/dL (ref 0.0–149.0)
VLDL: 17 mg/dL (ref 0.0–40.0)

## 2018-12-11 LAB — PSA: PSA: 2.76 ng/mL (ref 0.10–4.00)

## 2018-12-11 MED ORDER — FLUOXETINE HCL 20 MG PO CAPS: 40.0000 mg | ORAL_CAPSULE | Freq: Every day | ORAL | 1 refills | Status: DC

## 2018-12-11 NOTE — Patient Instructions (Addendum)
GO TO THE LAB : Get the blood work     GO TO THE FRONT DESK Schedule your next appointment   for a checkup in 3 months  Go back on fluoxetine 20 mg: The first 10 days, take 1 capsule a day After that take 2 capsule every day

## 2018-12-11 NOTE — Telephone Encounter (Signed)
Cologuard ordered through [employer] portal.

## 2018-12-11 NOTE — Progress Notes (Signed)
Pre visit review using our clinic review tool, if applicable. No additional management support is needed unless otherwise documented below in the visit note. 

## 2018-12-11 NOTE — Assessment & Plan Note (Addendum)
-  Td 2012 - pt requested shingrex, we are out, he will call back in 2 months  - flu shot today --CCS: never had a cscope, 3 modalities discussed again today. Failed to return an IFOB 2019. This year elected cologuard, strongly rec to check cost w/ his insurance -prostate ca screening : DRE slightly abnormal, 3-4 mm extraprostatic induration on the right (documented on the left last year).  Asymptomatic.  Check a PSA. -Diet and exercise discussed -Labs: CMP FLP CBC PSA UDS.

## 2018-12-11 NOTE — Progress Notes (Signed)
Subjective:    Patient ID: Michael Berg, male    DOB: 10/25/1965, 53 y.o.   MRN: [id number]  DOS:  12/11/2018 Type of visit - description: CPX In general feeling well. We did talk about anxiety, depression, insomnia.   Review of Systems  A 14 point review of systems is negative    Past Medical History:  Diagnosis Date  . Allergy   . Anxiety and depression    Hx of anxiety and Depression due to divorce. Not experiencing any concerns at this moment.  . Dyslipidemia   . Insomnia     Past Surgical History:  Procedure Laterality Date  . APPENDECTOMY     age 35    Social History   Socioeconomic History  . Marital status: Divorced    Spouse name: Not on file  . Number of children: 2  . Years of education: Not on file  . Highest education level: Not on file  Occupational History  . 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 Financial resource strain: Not on file  . Food insecurity    Worry: Not on file    Inability: Not on file  . Transportation needs    Medical: Not on file    Non-medical: Not on file  Tobacco Use  . Smoking status: Former Research scientist (life sciences)  . Smokeless tobacco: Never Used  Substance and Sexual Activity  . Alcohol use: Yes    Comment: socially  . Drug use: No  . Sexual activity: Yes  Lifestyle  . Physical activity    Days per week: Not on file    Minutes per session: Not on file  . Stress: Not on file  Relationships  . Social Herbalist on phone: Not on file    Gets together: Not on file    Attends religious service: Not on file    Active member of club or organization: Not on file    Attends meetings of clubs or organizations: Not on file    Relationship status: Not on file  . Intimate partner violence    Fear of current or ex partner: Not on file    Emotionally abused: Not on file    Physically abused: Not on file    Forced sexual activity: Not on file  Other Topics Concern  . Not  on file  Social History Narrative   Household- pt and fiancee         Family History  Problem Relation Age of Onset  . Coronary artery disease Father        CABG at age 74  . Colon cancer Other        uncle dx at age 51 (?)  . Diabetes Paternal Grandmother   . Heart defect Brother        no CAD, open heart surgery as an adult  . Prostate cancer Neg Hx      Allergies as of 12/11/2018   No Known Allergies     Medication List       Accurate as of December 11, 2018 11:59 PM. If you have any questions, ask your nurse or doctor.        ALPRAZolam 0.5 MG tablet Commonly known as: XANAX Take 1-2 tablets (0.5-1 mg total) by mouth at bedtime as needed. for sleep   atorvastatin 80 MG tablet Commonly known as: LIPITOR Take 1 tablet (80 mg total) by mouth daily.   FLUoxetine  20 MG capsule Commonly known as: PROZAC Take 2 capsules (40 mg total) by mouth daily. What changed: how much to take Changed by: Kathlene November, MD   MELATONIN GUMMIES PO Take 2 tablets by mouth at bedtime.   meloxicam 15 MG tablet Commonly known as: MOBIC TAKE 1 TABLET BY MOUTH EVERY DAY AS NEEDED FOR PAIN   multivitamin tablet Take 1 tablet by mouth daily.   ZYRTEC ALLERGY PO Take by mouth.           Objective:   Physical Exam BP 126/72 (BP Location: Left Arm, Patient Position: Sitting, Cuff Size: Normal)   Pulse 76   Temp (!) 97.4 F (36.3 C) (Temporal)   Resp 16   Ht 6\' 1"  (1.854 m)   Wt 221 lb 8 oz (100.5 kg)   SpO2 100%   BMI 29.22 kg/m  General: Well developed, NAD, BMI noted Neck: No  thyromegaly  HEENT:  Normocephalic . Face symmetric, atraumatic Lungs:  CTA B Normal respiratory effort, no intercostal retractions, no accessory muscle use. Heart: RRR,  no murmur.  No pretibial edema bilaterally  Abdomen:  Not distended, soft, non-tender. No rebound or rigidity.   Skin: Exposed areas without rash. Not pale. Not jaundice DRE: Normal sphincter tone, no stools. Prostate  slightly enlarged.  Noted minute (3-4 mm) induration at the right side, proximally, extraprostatic. Neurologic:  alert & oriented X3.  Speech normal, gait appropriate for age and unassisted Strength symmetric and appropriate for age.  Psych: Cognition and judgment appear intact.  Cooperative with normal attention span and concentration.  Behavior appropriate. No anxious or depressed appearing.     Assessment     Assessment  (restablished 01-2016) anxiety depression insomnia  (divorce related in the past, sx resurface ~01-2016 work related) Hyperlipidemia  PLAN:  Anxiety, depression, insomnia: Increasing stress lately, few months ago he self increased fluoxetine 20 mg to 2 tablets daily, ran out 4 weeks ago.  Would like to go back on it. RF Prozac 20 mg 2 tablets daily. See AVS Hyperlipidemia: On atorvastatin, checking labs L arm pain: Follow-up elsewhere RTC 3 months

## 2018-12-12 NOTE — Assessment & Plan Note (Signed)
Anxiety, depression, insomnia: Increasing stress lately, few months ago he self increased fluoxetine 20 mg to 2 tablets daily, ran out 4 weeks ago.  Would like to go back on it. RF Prozac 20 mg 2 tablets daily. See AVS Hyperlipidemia: On atorvastatin, checking labs L arm pain: Follow-up elsewhere RTC 3 months

## 2018-12-13 LAB — PAIN MGMT, PROFILE 8 W/CONF, U
6 Acetylmorphine: NEGATIVE ng/mL
Alcohol Metabolites: POSITIVE ng/mL — AB (ref <500)
Alphahydroxyalprazolam: 54 ng/mL
Alphahydroxymidazolam: NEGATIVE ng/mL
Alphahydroxytriazolam: NEGATIVE ng/mL
Aminoclonazepam: NEGATIVE ng/mL
Amphetamines: NEGATIVE ng/mL
Benzodiazepines: POSITIVE ng/mL
Buprenorphine, Urine: NEGATIVE ng/mL
Cocaine Metabolite: NEGATIVE ng/mL
Creatinine: 157.4 mg/dL
Ethyl Glucuronide (ETG): 958 ng/mL
Ethyl Sulfate (ETS): 150 ng/mL
Hydroxyethylflurazepam: NEGATIVE ng/mL
Lorazepam: NEGATIVE ng/mL
MDMA: NEGATIVE ng/mL
Marijuana Metabolite: NEGATIVE ng/mL
Nordiazepam: NEGATIVE ng/mL
Opiates: NEGATIVE ng/mL
Oxazepam: NEGATIVE ng/mL
Oxidant: NEGATIVE ug/mL
Oxycodone: NEGATIVE ng/mL
Temazepam: NEGATIVE ng/mL
pH: 5.9 (ref 4.5–9.0)

## 2019-01-25 ENCOUNTER — Other Ambulatory Visit: Payer: Self-pay | Admitting: Internal Medicine

## 2019-01-25 NOTE — Telephone Encounter (Signed)
Rx faxed to [facility] in [place].

## 2019-01-25 NOTE — Telephone Encounter (Signed)
Alprazolam refill.   Last OV: 12/11/2018 Last Fill: 07/17/2018 #60 and 2RF Pt sig: 1-2 tab qhs prn UDS: 12/11/2018 Low risk

## 2019-01-25 NOTE — Telephone Encounter (Signed)
Attempted to send electronically several times and for some reason I was unable to. Printed prescription, #60 and 2 refills, please fax

## 2019-03-09 ENCOUNTER — Encounter: Payer: Self-pay | Admitting: Internal Medicine

## 2019-03-12 ENCOUNTER — Other Ambulatory Visit: Payer: Self-pay

## 2019-03-12 ENCOUNTER — Ambulatory Visit (INDEPENDENT_AMBULATORY_CARE_PROVIDER_SITE_OTHER): Payer: Managed Care, Other (non HMO) | Admitting: Internal Medicine

## 2019-03-12 DIAGNOSIS — F341 Dysthymic disorder: Secondary | ICD-10-CM

## 2019-03-12 DIAGNOSIS — E785 Hyperlipidemia, unspecified: Secondary | ICD-10-CM | POA: Diagnosis not present

## 2019-03-12 DIAGNOSIS — F5101 Primary insomnia: Secondary | ICD-10-CM | POA: Diagnosis not present

## 2019-03-12 MED ORDER — ALPRAZOLAM 0.5 MG PO TABS: 0.5000 mg | ORAL_TABLET | Freq: Every evening | ORAL | 4 refills | Status: DC | PRN

## 2019-03-12 NOTE — Progress Notes (Signed)
Subjective:    Patient ID: Michael Berg, male    DOB: [birth year], 53 y.o.   MRN: [id number]  DOS:  03/12/2019 Type of visit - description: Virtual Visit via Video Note  I connected with the above patient  by a video enabled telemedicine application and verified that I am speaking with the correct person using two identifiers.   THIS ENCOUNTER IS A VIRTUAL VISIT DUE TO COVID-19 - PATIENT WAS NOT SEEN IN THE OFFICE. PATIENT HAS CONSENTED TO VIRTUAL VISIT / TELEMEDICINE VISIT   Location of patient: home  Location of provider: office  I discussed the limitations of evaluation and management by telemedicine and the availability of in person appointments. The patient expressed understanding and agreed to proceed.  History of Present Illness: Follow-up At the last visit, fluoxetine dose was increased, reports that has no side effects and is feeling great emotionally. Also, he came in contact with a potential Covid case last week, this person is not sick himself.      Review of Systems In general feeling well but in the last 24 h has developed mild headache and sore throat.  Also mild cough. Denies fever chills   Past Medical History:  Diagnosis Date  . Allergy   . Anxiety and depression    Hx of anxiety and Depression due to divorce. Not experiencing any concerns at this moment.  . Dyslipidemia   . Insomnia     Past Surgical History:  Procedure Laterality Date  . APPENDECTOMY     age 45    Social History   Socioeconomic History  . Marital status: Divorced    Spouse name: Not on file  . Number of children: 2  . Years of education: Not on file  . Highest education level: Not on file  Occupational History  . 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 Smoking status: Former Research scientist (life sciences)  . Smokeless tobacco: Never Used  Substance and Sexual Activity  . Alcohol use: Yes    Comment: socially  . Drug use: No  . Sexual  activity: Yes  Other Topics Concern  . Not on file  Social History Narrative   Household- pt and fiancee       Social Determinants of Health   Financial Resource Strain:   . Difficulty of Paying Living Expenses: Not on file  Food Insecurity:   . Worried About Charity fundraiser in the Last Year: Not on file  . Ran Out of Food in the Last Year: Not on file  Transportation Needs:   . Lack of Transportation (Medical): Not on file  . Lack of Transportation (Non-Medical): Not on file  Physical Activity:   . Days of Exercise per Week: Not on file  . Minutes of Exercise per Session: Not on file  Stress:   . Feeling of Stress : Not on file  Social Connections:   . Frequency of Communication with Friends and Family: Not on file  . Frequency of Social Gatherings with Friends and Family: Not on file  . Attends Religious Services: Not on file  . Active Member of Clubs or Organizations: Not on file  . Attends Archivist Meetings: Not on file  . Marital Status: Not on file  Intimate Partner Violence:   . Fear of Current or Ex-Partner: Not on file  . Emotionally Abused: Not on file  . Physically Abused: Not on file  . Sexually Abused: Not on  file      Allergies as of 03/12/2019   No Known Allergies     Medication List       Accurate as of March 12, 2019 11:59 PM. If you have any questions, ask your nurse or doctor.        ALPRAZolam 0.5 MG tablet Commonly known as: XANAX Take 1-2 tablets (0.5-1 mg total) by mouth at bedtime as needed. for sleep   atorvastatin 80 MG tablet Commonly known as: LIPITOR Take 1 tablet (80 mg total) by mouth daily.   FLUoxetine 20 MG capsule Commonly known as: PROZAC Take 2 capsules (40 mg total) by mouth daily.   MELATONIN GUMMIES PO Take 2 tablets by mouth at bedtime.   meloxicam 15 MG tablet Commonly known as: MOBIC TAKE 1 TABLET BY MOUTH EVERY DAY AS NEEDED FOR PAIN   multivitamin tablet Take 1 tablet by mouth daily.     ZYRTEC ALLERGY PO Take by mouth.           Objective:   Physical Exam There were no vitals taken for this visit. This is virtual video visit, he is alert oriented x3, in no apparent distress.     Assessment     Assessment  (restablished 01-2016) anxiety depression insomnia  (divorce related in the past, sx resurface ~01-2016 work related) Hyperlipidemia  PLAN:  Anxiety, depression, insomnia: Since the last visit, he is doing great with fluoxetine 40 mg daily, melatonin, and occasional Xanax.  Rx for Xanax sent Encouraged to stay physically active, continue the same medications and call sooner than the next visit if needed. Hyperlipidemia: Excellent control on Lipitor COVID-19 exposure: Encouraged to continue following all the quarantine rules, he plans to be tested at his local pharmacy and will let me know of the results. RTC 9-20 21 CPX.   I discussed the assessment and treatment plan with the patient. The patient was provided an opportunity to ask questions and all were answered. The patient agreed with the plan and demonstrated an understanding of the instructions.   The patient was advised to call back or seek an in-person evaluation if the symptoms worsen or if the condition fails to improve as anticipated.

## 2019-03-13 NOTE — Assessment & Plan Note (Signed)
Anxiety, depression, insomnia: Since the last visit, he is doing great with fluoxetine 40 mg daily, melatonin, and occasional Xanax.  Rx for Xanax sent Encouraged to stay physically active, continue the same medications and call sooner than the next visit if needed. Hyperlipidemia: Excellent control on Lipitor COVID-19 exposure: Encouraged to continue following all the quarantine rules, he plans to be tested at his local pharmacy and will let me know of the results. RTC 9-20 21 CPX.

## 2019-03-26 DIAGNOSIS — Z8616 Personal history of COVID-19: Secondary | ICD-10-CM

## 2019-03-26 HISTORY — DX: Personal history of COVID-19: Z86.16

## 2019-05-17 ENCOUNTER — Other Ambulatory Visit: Payer: Self-pay | Admitting: Internal Medicine

## 2019-06-01 ENCOUNTER — Encounter: Payer: Self-pay | Admitting: Internal Medicine

## 2019-06-01 ENCOUNTER — Other Ambulatory Visit: Payer: Self-pay

## 2019-06-01 ENCOUNTER — Ambulatory Visit: Payer: Managed Care, Other (non HMO) | Admitting: Internal Medicine

## 2019-06-01 VITALS — Ht 73.0 in | Wt 218.0 lb

## 2019-06-01 NOTE — Progress Notes (Signed)
  Visit cancel by patient.

## 2019-06-01 NOTE — Progress Notes (Signed)
Pre visit review using our clinic review tool, if applicable. No additional management support is needed unless otherwise documented below in the visit note. 

## 2019-06-02 ENCOUNTER — Encounter: Payer: Self-pay | Admitting: Family Medicine

## 2019-06-02 ENCOUNTER — Ambulatory Visit (INDEPENDENT_AMBULATORY_CARE_PROVIDER_SITE_OTHER): Payer: Managed Care, Other (non HMO) | Admitting: Family Medicine

## 2019-06-02 ENCOUNTER — Other Ambulatory Visit: Payer: Self-pay

## 2019-06-02 VITALS — Temp 100.0°F | Ht 73.0 in | Wt 218.0 lb

## 2019-06-02 DIAGNOSIS — J0140 Acute pansinusitis, unspecified: Secondary | ICD-10-CM | POA: Diagnosis not present

## 2019-06-02 MED ORDER — AMOXICILLIN-POT CLAVULANATE 875-125 MG PO TABS: 1.0000 | ORAL_TABLET | Freq: Two times a day (BID) | ORAL | 0 refills | Status: DC

## 2019-06-02 MED ORDER — FLUTICASONE PROPIONATE 50 MCG/ACT NA SUSP: 2.0000 | Freq: Every day | NASAL | 6 refills | Status: AC

## 2019-06-02 NOTE — Progress Notes (Signed)
Virtual Visit via Video Note  I connected with Michael Berg on 06/02/19 at  9:00 AM EST by a video enabled telemedicine application and verified that I am speaking with the correct person using two identifiers.  Location: Patient: home alone  Provider: home    I discussed the limitations of evaluation and management by telemedicine and the availability of in person appointments. The patient expressed understanding and agreed to proceed.  History of Present Illness: Pt is home c/o body aches, congestion and pain behind his eyes, sore throat   He was tested for covidSunday and it was negative.  Symptoms started Friday night with a headache.   He has a lot of sinus pressure       Observations/Objective: Vitals:   06/02/19 0851  Temp: 100 F (37.8 C)   Pt is in nad Dark circles under eyes and sounds congested Tenderness front/ max sinuses   Assessment and Plan: 1. Acute non-recurrent pansinusitis abx and flonase Have low threshold for retesting for covid if symptoms do not improve-- can also get pt into resp clinic if needed May have been flu but past time for treatment with antiviral  - amoxicillin-clavulanate (AUGMENTIN) 875-125 MG tablet; Take 1 tablet by mouth 2 (two) times daily.  Dispense: 20 tablet; Refill: 0 - fluticasone (FLONASE) 50 MCG/ACT nasal spray; Place 2 sprays into both nostrils daily.  Dispense: 16 g; Refill: 6  Follow Up Instructions:    I discussed the assessment and treatment plan with the patient. The patient was provided an opportunity to ask questions and all were answered. The patient agreed with the plan and demonstrated an understanding of the instructions.   The patient was advised to call back or seek an in-person evaluation if the symptoms worsen or if the condition fails to improve as anticipated.  I provided 30 minutes of non-face-to-face time during this encounter.   Ann Held, DO

## 2019-06-03 ENCOUNTER — Encounter: Payer: Self-pay | Admitting: Family Medicine

## 2019-06-04 NOTE — Telephone Encounter (Signed)
He needs to finish the antibiotic

## 2019-06-07 ENCOUNTER — Other Ambulatory Visit: Payer: Self-pay

## 2019-06-07 ENCOUNTER — Encounter: Payer: Self-pay | Admitting: Internal Medicine

## 2019-06-07 ENCOUNTER — Ambulatory Visit (INDEPENDENT_AMBULATORY_CARE_PROVIDER_SITE_OTHER): Payer: Managed Care, Other (non HMO) | Admitting: Internal Medicine

## 2019-06-07 VITALS — Ht 73.0 in | Wt 218.0 lb

## 2019-06-07 DIAGNOSIS — J019 Acute sinusitis, unspecified: Secondary | ICD-10-CM

## 2019-06-07 DIAGNOSIS — F341 Dysthymic disorder: Secondary | ICD-10-CM

## 2019-06-07 NOTE — Progress Notes (Signed)
Pre visit review using our clinic review tool, if applicable. No additional management support is needed unless otherwise documented below in the visit note. 

## 2019-06-07 NOTE — Progress Notes (Signed)
Subjective:    Patient ID: Michael Berg, male    DOB: Aug 22, 1965, 54 y.o.   MRN: [id number]  DOS:  06/07/2019 Type of visit - description: Virtual Visit via Video Note  I connected with the above patient  by a video enabled telemedicine application and verified that I am speaking with the correct person using two identifiers.   THIS ENCOUNTER IS A VIRTUAL VISIT DUE TO COVID-19 - PATIENT WAS NOT SEEN IN THE OFFICE. PATIENT HAS CONSENTED TO VIRTUAL VISIT / TELEMEDICINE VISIT   Location of patient: home  Location of provider: office  I discussed the limitations of evaluation and management by telemedicine and the availability of in person appointments. The patient expressed understanding and agreed to proceed.   Acute Was seen by Dr. Etter Sjogren 06/02/2019, note reviewed, diagnosed with pansinusitis, prescribed Augmentin. At this point he is better but needs a letter releasing him back to work. Since he started taking antibiotics, the sinus congestion and discharge is 80-90% better.  Anxiety well controlled, wonders if he needs a refill on Xanax  Review of Systems Denies fever chills No rash No chest pain no difficulty breathing  Past Medical History:  Diagnosis Date  . Allergy   . Anxiety and depression    Hx of anxiety and Depression due to divorce. Not experiencing any concerns at this moment.  . Dyslipidemia   . Insomnia     Past Surgical History:  Procedure Laterality Date  . APPENDECTOMY     age 54    Allergies as of 06/07/2019   No Known Allergies     Medication List       Accurate as of June 07, 2019  9:46 AM. If you have any questions, ask your nurse or doctor.        ALPRAZolam 0.5 MG tablet Commonly known as: XANAX Take 1-2 tablets (0.5-1 mg total) by mouth at bedtime as needed. for sleep   amoxicillin-clavulanate 875-125 MG tablet Commonly known as: Augmentin Take 1 tablet by mouth 2 (two) times daily.   atorvastatin 80 MG tablet Commonly known  as: LIPITOR Take 1 tablet (80 mg total) by mouth daily.   FLUoxetine 20 MG capsule Commonly known as: PROZAC Take 2 capsules (40 mg total) by mouth daily.   fluticasone 50 MCG/ACT nasal spray Commonly known as: FLONASE Place 2 sprays into both nostrils daily.   MELATONIN GUMMIES PO Take 2 tablets by mouth at bedtime.   meloxicam 15 MG tablet Commonly known as: MOBIC TAKE 1 TABLET BY MOUTH EVERY DAY AS NEEDED FOR PAIN   multivitamin tablet Take 1 tablet by mouth daily.   ZYRTEC ALLERGY PO Take by mouth.          Objective:   Physical Exam Ht 6\' 1"  (1.854 m)   Wt 218 lb (98.9 kg)   BMI 28.76 kg/m  This is a virtual video visit, alert oriented x3, minimal nose congestion noted.    Assessment    Assessment  (restablished 01-2016) anxiety depression insomnia  (divorce related in the past, sx resurface ~01-2016 work related) Hyperlipidemia  PLAN:  Pansinusitis: After respiratory symptoms started, he tested negative for Covid approximately 05/30/2019, started antibiotics 06/02/2019, he feels 80% better, feels he is ready to go back to work.  A letter releasing him back to work is sent. Also, recommend to continue Flonase for the next several weeks. Anxiety, depression, insomnia: Doing well, wonders about a refill on Xanax, he should have enough till April nevertheless he  will let me know if a prescription is needed.  Symptoms well controlled.    This visit occurred during the SARS-CoV-2 public health emergency.  Safety protocols were in place, including screening questions prior to the visit, additional usage of staff PPE, and extensive cleaning of exam room while observing appropriate contact time as indicated for disinfecting solutions.

## 2019-06-08 NOTE — Assessment & Plan Note (Signed)
Pansinusitis: After respiratory symptoms started, he tested negative for Covid approximately 05/30/2019, started antibiotics 06/02/2019, he feels 80% better, feels he is ready to go back to work.  A letter releasing him back to work is sent. Also, recommend to continue Flonase for the next several weeks. Anxiety, depression, insomnia: Doing well, wonders about a refill on Xanax, he should have enough till April nevertheless he will let me know if a prescription is needed.  Symptoms well controlled.

## 2019-06-09 ENCOUNTER — Other Ambulatory Visit: Payer: Self-pay | Admitting: Internal Medicine

## 2019-06-14 ENCOUNTER — Emergency Department (HOSPITAL_BASED_OUTPATIENT_CLINIC_OR_DEPARTMENT_OTHER)
Admission: EM | Admit: 2019-06-14 | Discharge: 2019-06-14 | Disposition: A | Discharge status: Home / Self Care | Payer: Managed Care, Other (non HMO) | Attending: Emergency Medicine | Admitting: Emergency Medicine

## 2019-06-14 ENCOUNTER — Other Ambulatory Visit: Payer: Self-pay

## 2019-06-14 ENCOUNTER — Emergency Department (HOSPITAL_BASED_OUTPATIENT_CLINIC_OR_DEPARTMENT_OTHER): Payer: Managed Care, Other (non HMO)

## 2019-06-14 ENCOUNTER — Encounter (HOSPITAL_BASED_OUTPATIENT_CLINIC_OR_DEPARTMENT_OTHER): Payer: Self-pay | Admitting: *Deleted

## 2019-06-14 DIAGNOSIS — E785 Hyperlipidemia, unspecified: Secondary | ICD-10-CM | POA: Diagnosis not present

## 2019-06-14 DIAGNOSIS — R1031 Right lower quadrant pain: Secondary | ICD-10-CM | POA: Insufficient documentation

## 2019-06-14 DIAGNOSIS — Z87891 Personal history of nicotine dependence: Secondary | ICD-10-CM | POA: Insufficient documentation

## 2019-06-14 DIAGNOSIS — Z79899 Other long term (current) drug therapy: Secondary | ICD-10-CM | POA: Insufficient documentation

## 2019-06-14 LAB — CBC WITH DIFFERENTIAL/PLATELET
Abs Immature Granulocytes: 0.07 10*3/uL (ref 0.00–0.07)
Basophils Absolute: 0 10*3/uL (ref 0.0–0.1)
Basophils Relative: 0 %
Eosinophils Absolute: 0.2 10*3/uL (ref 0.0–0.5)
Eosinophils Relative: 2 %
HCT: 40.2 % (ref 39.0–52.0)
Hemoglobin: 13.2 g/dL (ref 13.0–17.0)
Immature Granulocytes: 1 %
Lymphocytes Relative: 8 %
Lymphs Abs: 0.9 10*3/uL (ref 0.7–4.0)
MCH: 29.1 pg (ref 26.0–34.0)
MCHC: 32.8 g/dL (ref 30.0–36.0)
MCV: 88.7 fL (ref 80.0–100.0)
Monocytes Absolute: 0.8 10*3/uL (ref 0.1–1.0)
Monocytes Relative: 8 %
Neutro Abs: 8.7 10*3/uL — ABNORMAL HIGH (ref 1.7–7.7)
Neutrophils Relative %: 81 %
Platelets: 219 10*3/uL (ref 150–400)
RBC: 4.53 MIL/uL (ref 4.22–5.81)
RDW: 12.4 % (ref 11.5–15.5)
WBC: 10.7 10*3/uL — ABNORMAL HIGH (ref 4.0–10.5)
nRBC: 0 % (ref 0.0–0.2)

## 2019-06-14 LAB — COMPREHENSIVE METABOLIC PANEL
ALT: 68 U/L — ABNORMAL HIGH (ref 0–44)
AST: 26 U/L (ref 15–41)
Albumin: 3.5 g/dL (ref 3.5–5.0)
Alkaline Phosphatase: 65 U/L (ref 38–126)
Anion gap: 9 (ref 5–15)
BUN: 10 mg/dL (ref 6–20)
CO2: 24 mmol/L (ref 22–32)
Calcium: 8.4 mg/dL — ABNORMAL LOW (ref 8.9–10.3)
Chloride: 103 mmol/L (ref 98–111)
Creatinine, Ser: 0.95 mg/dL (ref 0.61–1.24)
GFR calc Af Amer: >60 mL/min (ref >60)
GFR calc non Af Amer: >60 mL/min (ref >60)
Glucose, Bld: 91 mg/dL (ref 70–99)
Potassium: 3.8 mmol/L (ref 3.5–5.1)
Sodium: 136 mmol/L (ref 135–145)
Total Bilirubin: 0.5 mg/dL (ref 0.3–1.2)
Total Protein: 6.7 g/dL (ref 6.5–8.1)

## 2019-06-14 LAB — LIPASE, BLOOD: Lipase: 38 U/L (ref 11–51)

## 2019-06-14 MED ORDER — MORPHINE SULFATE (PF) 4 MG/ML IV SOLN: 4.0000 mg | Freq: Once | INTRAVENOUS | Status: DC

## 2019-06-14 MED ORDER — ONDANSETRON 4 MG PO TBDP: 4.0000 mg | ORAL_TABLET | Freq: Three times a day (TID) | ORAL | 0 refills | Status: DC | PRN

## 2019-06-14 MED ORDER — FENTANYL CITRATE (PF) 100 MCG/2ML IJ SOLN
50.0000 ug | Freq: Once | INTRAMUSCULAR | Status: AC
  Administered 2019-06-14: 50 ug via INTRAVENOUS
  Filled 2019-06-14: qty 2

## 2019-06-14 MED ORDER — ONDANSETRON HCL 4 MG/2ML IJ SOLN: 4.0000 mg | Freq: Once | INTRAMUSCULAR | Status: DC

## 2019-06-14 MED ORDER — DICYCLOMINE HCL 20 MG PO TABS: 20.0000 mg | ORAL_TABLET | Freq: Two times a day (BID) | ORAL | 0 refills | Status: DC

## 2019-06-14 MED ORDER — SODIUM CHLORIDE 0.9 % IV SOLN: Freq: Once | INTRAVENOUS | Status: AC

## 2019-06-14 MED ORDER — ONDANSETRON HCL 4 MG/2ML IJ SOLN
4.0000 mg | Freq: Once | INTRAMUSCULAR | Status: AC
  Administered 2019-06-14: 4 mg via INTRAVENOUS
  Filled 2019-06-14: qty 2

## 2019-06-14 MED ORDER — IOHEXOL 300 MG/ML  SOLN
100.0000 mL | Freq: Once | INTRAMUSCULAR | Status: AC | PRN
  Administered 2019-06-14: 100 mL via INTRAVENOUS

## 2019-06-14 NOTE — ED Notes (Signed)
Patient transported to CT 

## 2019-06-14 NOTE — ED Notes (Signed)
ED Provider at bedside. 

## 2019-06-14 NOTE — ED Notes (Signed)
Pt has powerade from home that he is drinking.

## 2019-06-14 NOTE — ED Triage Notes (Signed)
Abdominal pain, body aches, fever, chills, sore throat and cough. He had a negative Covid test 2 days ago.

## 2019-06-14 NOTE — ED Provider Notes (Addendum)
[place] EMERGENCY DEPARTMENT Provider Note   CSN: [id number] Arrival date & time: 06/14/19  1307     History Chief Complaint  Patient presents with  . Abdominal Pain    Michael Berg is a 54 y.o. male.  HPI  Patient is a 54 year old male with past medical history of HLD, anxiety and depression.  Status post appendectomy as a teenager.  No abdominal surgeries other than this.  Patient presents today for right lower quadrant abdominal pain which is constant, achy, worse with touch and movement.  Patient states that his symptoms began Friday when he states that he stood up suddenly out of his car and felt very lightheaded and weak and when he sat down continue to feel chills.  He checked his temperature and was 103 degrees.  He states at the time he also experienced a cough which abated after 2 days.  He states that he was nauseous and had one episode of vomiting.  He states that his symptoms have improved in the next few days.  He states that he had to ED today to be evaluated since his symptoms of right lower quadrant abdominal pain persist.      Past Medical History:  Diagnosis Date  . Allergy   . Anxiety and depression    Hx of anxiety and Depression due to divorce. Not experiencing any concerns at this moment.  . Dyslipidemia   . Insomnia     Patient Active Problem List   Diagnosis Date Noted  . PCP NOTES >>>>>>>>>>>>.. 02/20/2016  . Leg cramps 02/20/2016  . Annual physical exam 01/11/2011  . Dyslipidemia 06/19/2006  . ANXIETY DEPRESSION 06/19/2006  . Insomnia 06/19/2006    Past Surgical History:  Procedure Laterality Date  . APPENDECTOMY     age 88       Family History  Problem Relation Age of Onset  . Coronary artery disease Father        CABG at age 56  . Colon cancer Other        uncle dx at age 81 (?)  . Diabetes Paternal Grandmother   . Heart defect Brother        no CAD, open heart surgery as an adult  . Prostate cancer Neg Hx      Social History   Tobacco Use  . Smoking status: Former Research scientist (life sciences)  . Smokeless tobacco: Never Used  Substance Use Topics  . Alcohol use: Yes    Comment: socially  . Drug use: No    Home Medications Prior to Admission medications   Medication Sig Start Date End Date Taking? Authorizing Provider  ALPRAZolam Duanne Moron) 0.5 MG tablet Take 1-2 tablets (0.5-1 mg total) by mouth at bedtime as needed. for sleep 03/12/19   Colon Branch, MD  amoxicillin-clavulanate (AUGMENTIN) 875-125 MG tablet Take 1 tablet by mouth 2 (two) times daily. 06/02/19   Ann Held, DO  atorvastatin (LIPITOR) 80 MG tablet Take 1 tablet (80 mg total) by mouth daily. 05/17/19   Colon Branch, MD  Cetirizine HCl (ZYRTEC ALLERGY PO) Take by mouth.    [provider]  dicyclomine (BENTYL) 20 MG tablet Take 1 tablet (20 mg total) by mouth 2 (two) times daily. 06/14/19   Tedd Sias, PA  FLUoxetine (PROZAC) 20 MG capsule Take 2 capsules (40 mg total) by mouth daily. 06/09/19   Colon Branch, MD  fluticasone [facility]) 50 MCG/ACT nasal spray Place 2 sprays into both nostrils daily.  06/02/19   Roma Schanz R, DO  MELATONIN GUMMIES PO Take 2 tablets by mouth at bedtime.    [provider]  meloxicam (MOBIC) 15 MG tablet TAKE 1 TABLET BY MOUTH EVERY DAY AS NEEDED FOR PAIN 07/20/18   Colon Branch, MD  Multiple Vitamin (MULTIVITAMIN) tablet Take 1 tablet by mouth daily.      [provider]  ondansetron (ZOFRAN ODT) 4 MG disintegrating tablet Take 1 tablet (4 mg total) by mouth every 8 (eight) hours as needed for nausea or vomiting. 06/14/19   Tedd Sias, PA    Allergies    Patient has no known allergies.  Review of Systems   Review of Systems  Constitutional: Negative for chills and fever.  HENT: Negative for congestion.   Eyes: Negative for pain.  Respiratory: Positive for cough. Negative for shortness of breath.   Cardiovascular: Negative for chest pain and leg swelling.   Gastrointestinal: Positive for abdominal pain and nausea. Negative for vomiting.  Genitourinary: Negative for dysuria.  Musculoskeletal: Negative for myalgias.  Skin: Negative for rash.  Neurological: Negative for dizziness and headaches.    Physical Exam Updated Vital Signs BP 131/90 (BP Location: Right Arm)   Pulse 74   Temp 98.7 F (37.1 C) (Oral)   Resp 19   Ht 6\' 1"  (1.854 m)   Wt 98 kg   SpO2 100%   BMI 28.50 kg/m   Physical Exam Vitals and nursing note reviewed.  Constitutional:      General: He is in acute distress.     Comments: Patient is acutely uncomfortable, pleasant 54 year old male appears stated age. Able answer questions appropriately and follow commands   HENT:     Head: Normocephalic and atraumatic.     Nose: Nose normal.  Eyes:     General: No scleral icterus. Cardiovascular:     Rate and Rhythm: Normal rate and regular rhythm.     Pulses: Normal pulses.     Heart sounds: Normal heart sounds.  Pulmonary:     Effort: Pulmonary effort is normal. No respiratory distress.     Breath sounds: No wheezing.  Abdominal:     Palpations: Abdomen is soft.     Tenderness: There is abdominal tenderness in the right lower quadrant.     Comments: Abdomen soft with tender right lower quadrant. No guarding, rigidity, or rebound. Tenderness with percussion of the right lower quadrant. Surgical scar from remote appendectomy in right lower quadrant.  Genitourinary:    Testes: Normal.     Comments: No tenderness to palpation of bilateral testes Musculoskeletal:     Cervical back: Normal range of motion.     Right lower leg: No edema.     Left lower leg: No edema.  Skin:    General: Skin is warm and dry.     Capillary Refill: Capillary refill takes less than 2 seconds.  Neurological:     Mental Status: He is alert. Mental status is at baseline.  Psychiatric:        Mood and Affect: Mood normal.        Behavior: Behavior normal.     ED Results / Procedures /  Treatments   Labs (all labs ordered are listed, but only abnormal results are displayed) Labs Reviewed  CBC WITH DIFFERENTIAL/PLATELET - Abnormal; Notable for the following components:      Result Value   WBC 10.7 (*)    Neutro Abs 8.7 (*)    All other  components within normal limits  COMPREHENSIVE METABOLIC PANEL - Abnormal; Notable for the following components:   Calcium 8.4 (*)    ALT 68 (*)    All other components within normal limits  GI PATHOGEN PANEL BY PCR, STOOL  LIPASE, BLOOD    EKG None  Radiology CT ABDOMEN PELVIS W CONTRAST  Result Date: 06/14/2019 CLINICAL DATA:  Right lower quadrant pain. EXAM: CT ABDOMEN AND PELVIS WITH CONTRAST TECHNIQUE: Multidetector CT imaging of the abdomen and pelvis was performed using the standard protocol following bolus administration of intravenous contrast. CONTRAST:  166mL OMNIPAQUE IOHEXOL 300 MG/ML  SOLN COMPARISON:  None. FINDINGS: Lower chest: No acute abnormality. Hepatobiliary: No focal liver abnormality is seen. No gallstones, gallbladder wall thickening, or biliary dilatation. Pancreas: Unremarkable. No pancreatic ductal dilatation or surrounding inflammatory changes. Spleen: Normal in size without focal abnormality. Adrenals/Urinary Tract: A 1.4 cm low-attenuation right adrenal mass is seen. The left adrenal gland is unremarkable. Kidneys are normal, without renal calculi, focal lesion, or hydronephrosis. Bladder is unremarkable. Stomach/Bowel: Stomach is within normal limits. The appendix is not clearly identified. No evidence of bowel dilatation. Mild diffuse colonic wall thickening is seen. This is slightly more prominent within the ascending and transverse colon. Vascular/Lymphatic: Very mild aortic calcification. No enlarged abdominal or pelvic lymph nodes. Reproductive: The prostate gland is mildly enlarged. Other: No abdominal wall hernia or abnormality. No abdominopelvic ascites. Musculoskeletal: No acute or significant osseous  findings. IMPRESSION: 1. Mild diffuse colonic wall thickening, slightly more prominent within the ascending and transverse colon. Findings are suggestive of an infectious or inflammatory colitis. 2. 1.4 cm low-attenuation right adrenal mass, statistically most likely to represent an adrenal adenoma. Aortic Atherosclerosis (ICD10-I70.0). Electronically Signed   By: Virgina Norfolk M.D.   On: 06/14/2019 15:08    Procedures Procedures (including critical care time)  Medications Ordered in ED Medications  0.9 %  sodium chloride infusion ( Intravenous Stopped 06/14/19 1540)  fentaNYL (SUBLIMAZE) injection 50 mcg (50 mcg Intravenous Given 06/14/19 1431)  ondansetron (ZOFRAN) injection 4 mg (4 mg Intravenous Given 06/14/19 1429)  iohexol (OMNIPAQUE) 300 MG/ML solution 100 mL (100 mLs Intravenous Contrast Given 06/14/19 1458)    ED Course  I have reviewed the triage vital signs and the nursing notes.  Pertinent labs & imaging results that were available during my care of the patient were reviewed by me and considered in my medical decision making (see chart for details).  Patient is a 54 year old male with a history of dyslipidemia, allergy and depression presenting today with history of abdominal pain, body aches, fevers, chills, sore throat and cough. Most of the symptoms have completely resolved however patient has some right lower quadrant abdominal pain that persists but has been improving. On examination patient has tenderness palpable in the right lower quadrant which is significant.  Patient is status post appendectomy however he is tender right over surgical scar. Concern for surgical complication even though the surgery was remote given that patient had surgery [place] no [place].  CT scan ordered to evaluate which showed no acute abnormalities other than mild colonic dilatation consistent with colitis. Symptoms likely viral. Incidental finding of possible adrenal "opacity "which patient  may follow-up with PCP for.   Clinical Course as of Jun 13 2299  Mon Jun 14, 2019  2259 CMP unremarkable. CBC with no significant leukocytosis and no anemia. Lipase within normal limits. Patient unable to produce stool sample. States that his diarrhea seems to be significantly improved.   [WF]  2259 Patient will follow up with his primary care doctor for reevaluation of his abdomen within 48 hours and for stool sample to be collected by PCP.   [WF]  2300 Vital signs remained within normal limits during entirety of ED stay. He continues to be afebrile. No diarrhea during ED stay and no melena or hematochezia per patient's history.   [WF]    Clinical Course User Index [WF] Tedd Sias, [place]   Patient significantly improved after 1 L normal saline, fentanyl and Zofran. He states no pain and no nausea. His vital signs continue his normal limits. He is tolerating p.o. without difficulty.  We'll discharge with Zofran and Bentyl. Patient will continue to monitor symptoms, given strict return precautions.  MDM Rules/Calculators/A&P                      LAN HODGSON was evaluated in Emergency Department on 06/14/2019 for the symptoms described in the history of present illness. He was evaluated in the context of the global COVID-19 pandemic, which necessitated consideration that the patient might be at risk for infection with the SARS-CoV-2 virus that causes COVID-19. Institutional protocols and algorithms that pertain to the evaluation of patients at risk for COVID-19 are in a state of rapid change based on information released by regulatory bodies including the CDC and federal and state organizations. These policies and algorithms were followed during the patient's care in the ED.  I discussed this case with my attending physician who cosigned this note including patient's presenting symptoms, physical exam, and planned diagnostics and interventions. Attending physician stated agreement with  plan or made changes to plan which were implemented.   Attending physician assessed patient at bedside.  Final Clinical Impression(s) / ED Diagnoses Final diagnoses:  Right lower quadrant abdominal pain    Rx / DC Orders ED Discharge Orders         Ordered    ondansetron (ZOFRAN ODT) 4 MG disintegrating tablet  Every 8 hours PRN     06/14/19 1551    dicyclomine (BENTYL) 20 MG tablet  2 times daily     06/14/19 1551           Tedd Sias, [place] 06/14/19 2304    Tedd Sias, [place] 06/14/19 2305    Lucrezia Starch, MD 06/16/19 [phone]

## 2019-06-14 NOTE — ED Notes (Signed)
Pt made aware of need for stool sample. 

## 2019-06-14 NOTE — Discharge Instructions (Addendum)
Please use Zofran for nausea and Bentyl for pain.  Please use Tylenol and ibuprofen as well.  The cause of your colitis is viral meaning that an antibiotic is not indicated and would be unhelpful for you.  Please use Tylenol or ibuprofen for pain.  You may use 600 mg ibuprofen every 6 hours or 1000 mg of Tylenol every 6 hours.  You may choose to alternate between the 2.  This would be most effective.  Not to exceed 4 g of Tylenol within 24 hours.  Not to exceed 3200 mg ibuprofen 24 hours.

## 2019-06-15 ENCOUNTER — Telehealth: Payer: Self-pay | Admitting: Internal Medicine

## 2019-06-15 NOTE — Telephone Encounter (Addendum)
Spoke w/ Pt- he was seen at ED yesterday- he has not noticed a difference in pain yet but is not worse. He is supposed to be traveling for work in the next 2 weeks- we went ahead and scheduled visit for this Friday, March 26 at 1040.

## 2019-06-15 NOTE — Telephone Encounter (Signed)
Went to the ER with abdominal pain, CT abdomen: Mild diffuse colonic wall thickening, slightly more prominent within the ascending and transverse colon  and a 1.4 cm low-attenuation right adrenal mass, statistically most likely to represent an adrenal adenoma. Felt better after IV fluids and treated symptomatically.  Please call patient, advised to call me if is not gradually better in the next few days, will check for C. difficile.  Otherwise schedule an appointment in 2 weeks

## 2019-06-18 ENCOUNTER — Other Ambulatory Visit: Payer: Self-pay

## 2019-06-18 ENCOUNTER — Encounter: Payer: Self-pay | Admitting: Internal Medicine

## 2019-06-18 ENCOUNTER — Ambulatory Visit: Payer: Managed Care, Other (non HMO) | Admitting: Internal Medicine

## 2019-06-18 VITALS — BP 100/64 | HR 75 | Temp 97.1°F | Resp 18 | Ht 73.0 in | Wt 222.1 lb

## 2019-06-18 DIAGNOSIS — R197 Diarrhea, unspecified: Secondary | ICD-10-CM | POA: Diagnosis not present

## 2019-06-18 DIAGNOSIS — F341 Dysthymic disorder: Secondary | ICD-10-CM | POA: Diagnosis not present

## 2019-06-18 DIAGNOSIS — E278 Other specified disorders of adrenal gland: Secondary | ICD-10-CM

## 2019-06-18 DIAGNOSIS — K529 Noninfective gastroenteritis and colitis, unspecified: Secondary | ICD-10-CM | POA: Diagnosis not present

## 2019-06-18 NOTE — Progress Notes (Signed)
Subjective:    Patient ID: Michael Berg, male    DOB: 1965-04-06, 54 y.o.   MRN: [id number]  DOS:  06/18/2019 Type of visit - description: ED follow-up  Went to the ER 06/14/2019.  Had R abdominal pain, for few days prior to ER visit. He felt lightheaded and weak as well. Had a temperature of 103 degrees ~ 3/19, then developed diarrhea   Labs at the emergency room: AST slightly elevated at 68, calcium is slightly high at 8.4, white cells 10.7, slightly high.  No anemia  CT abdomen: Mild diffuse colonic wall thickening, infectious versus inflammatory colitis. 1.4 cm right adrenal mass, likely an adenoma  Symptoms felt to be viral thus  was released home.  Of note is, he was prescribed Augmentin 06/07/2018  Review of Systems Since the ER visit, he is better, no fever chills Less diarrhea. Still some soreness at the right lower quadrant No blood in the stool or mucus. He also like to talk about anxiety and depression   Past Medical History:  Diagnosis Date  . Allergy   . Anxiety and depression    Hx of anxiety and Depression due to divorce. Not experiencing any concerns at this moment.  . Dyslipidemia   . Insomnia     Past Surgical History:  Procedure Laterality Date  . APPENDECTOMY     age 36    Allergies as of 06/18/2019      Reactions   Augmentin [amoxicillin-pot Clavulanate]    Diarrhea, colitis, see OV 06-18-2019      Medication List       Accurate as of June 18, 2019  4:42 PM. If you have any questions, ask your nurse or doctor.        STOP taking these medications   amoxicillin-clavulanate 875-125 MG tablet Commonly known as: Augmentin Stopped by: Kathlene November, MD     TAKE these medications   ALPRAZolam 0.5 MG tablet Commonly known as: XANAX Take 1-2 tablets (0.5-1 mg total) by mouth at bedtime as needed. for sleep   atorvastatin 80 MG tablet Commonly known as: LIPITOR Take 1 tablet (80 mg total) by mouth daily.   dicyclomine 20 MG  tablet Commonly known as: BENTYL Take 1 tablet (20 mg total) by mouth 2 (two) times daily.   FLUoxetine 20 MG capsule Commonly known as: PROZAC Take 2 capsules (40 mg total) by mouth daily.   fluticasone 50 MCG/ACT nasal spray Commonly known as: FLONASE Place 2 sprays into both nostrils daily.   MELATONIN GUMMIES PO Take 2 tablets by mouth at bedtime.   meloxicam 15 MG tablet Commonly known as: MOBIC TAKE 1 TABLET BY MOUTH EVERY DAY AS NEEDED FOR PAIN   multivitamin tablet Take 1 tablet by mouth daily.   ondansetron 4 MG disintegrating tablet Commonly known as: Zofran ODT Take 1 tablet (4 mg total) by mouth every 8 (eight) hours as needed for nausea or vomiting.   ZYRTEC ALLERGY PO Take by mouth.          Objective:   Physical Exam BP 100/64 (BP Location: Left Arm, Patient Position: Sitting, Cuff Size: Normal)   Pulse 75   Temp (!) 97.1 F (36.2 C) (Temporal)   Resp 18   Ht 6\' 1"  (1.854 m)   Wt 222 lb 2 oz (100.8 kg)   SpO2 100%   BMI 29.31 kg/m  General:   Well developed, NAD, BMI noted.  HEENT:  Normocephalic . Face symmetric, atraumatic Lungs:  CTA  B Normal respiratory effort, no intercostal retractions, no accessory muscle use. Heart: RRR,  no murmur.  Abdomen:  Not distended, soft, minimal tenderness at the right lower quadrant without mass or rebound.  Good bowel sounds. Skin: Not pale. Not jaundice Lower extremities: no pretibial edema bilaterally  Neurologic:  alert & oriented X3.  Speech normal, gait appropriate for age and unassisted Psych--  Cognition and judgment appear intact.  Cooperative with normal attention span and concentration.  Behavior appropriate. Slightly anxious appearing but no depressed.       Assessment     Assessment  (restablished 01-2016) anxiety depression insomnia  (divorce related in the past, sx resurface ~01-2016 work related) Hyperlipidemia  PLAN:  Colitis, diarrhea:  Went to the ER with diarrhea, fever,  right-sided abdominal pain, CT showed diffuse colitis, he was treated conservatively.  All this happened in the context of taking Augmentin.  Has a history of appendectomy. Colitis and diarrhea possibly antibiotic related thus will list Augmentin on his allergy list. Continue fluids, symptomatic treatment, call if not better.  Also recommend probiotics Anxiety depression, insomnia: Not willing to continue taking SSRIs due to sexual side effects, currently taking SSRIs essentially "as needed".  This has been a long-term problem for the patient, he has been controlled on and off but side effects have always been an issue thus rec to see psychiatry for further management. Pansinusitis: Symptoms improve, see last visit. Adrenal mass per CT, incidental finding, 1.5 cm in size, likely an adenoma.   Revisit the issue at the next visit.  This visit occurred during the SARS-CoV-2 public health emergency.  Safety protocols were in place, including screening questions prior to the visit, additional usage of staff PPE, and extensive cleaning of exam room while observing appropriate contact time as indicated for disinfecting solutions.

## 2019-06-18 NOTE — Progress Notes (Signed)
Pre visit review using our clinic review tool, if applicable. No additional management support is needed unless otherwise documented below in the visit note. 

## 2019-06-18 NOTE — Patient Instructions (Addendum)
Drink plenty of fluids  Bland diet  Continue the nausea and cramping medication  Call if not gradually better in the next few days  Call if symptoms return  Take a probiotic over-the-counter such as align once a day for 1 month  Please reach out to one of the psychiatrists in town for treatment of anxiety depression

## 2019-06-18 NOTE — Assessment & Plan Note (Addendum)
Colitis, diarrhea:  Went to the ER with diarrhea, fever, right-sided abdominal pain, CT showed diffuse colitis, he was treated conservatively.  All this happened in the context of taking Augmentin.  Has a history of appendectomy. Colitis and diarrhea possibly antibiotic related thus will list Augmentin on his allergy list. Continue fluids, symptomatic treatment, call if not better.  Also recommend probiotics Anxiety depression, insomnia: Not willing to continue taking SSRIs due to sexual side effects, currently taking SSRIs essentially "as needed".  This has been a long-term problem for the patient, he has been controlled on and off but side effects have always been an issue thus rec to see psychiatry for further management. Pansinusitis: Symptoms improve, see last visit. Adrenal mass per CT, incidental finding, 1.5 cm in size, likely an adenoma.  Revisit the issue at the next visit.

## 2019-07-14 LAB — COLOGUARD: Cologuard: NEGATIVE

## 2019-07-20 ENCOUNTER — Encounter: Payer: Self-pay | Admitting: Internal Medicine

## 2019-09-09 ENCOUNTER — Encounter: Payer: Self-pay | Admitting: Internal Medicine

## 2019-09-09 ENCOUNTER — Telehealth: Payer: Self-pay | Admitting: Internal Medicine

## 2019-09-09 NOTE — Telephone Encounter (Signed)
Alprazolam refill.   Last OV: 06/18/2019, no future appt Last Fill: 03/12/2019 #60 and 4RF Pt sig: 1-2 tabs qhs prn UDS: 12/11/2018 Low risk

## 2019-09-09 NOTE — Telephone Encounter (Signed)
PDMP okay, RF sent 

## 2020-02-12 ENCOUNTER — Other Ambulatory Visit: Payer: Self-pay | Admitting: Internal Medicine

## 2020-05-14 ENCOUNTER — Other Ambulatory Visit: Payer: Self-pay | Admitting: Internal Medicine

## 2020-06-04 ENCOUNTER — Other Ambulatory Visit: Payer: Self-pay | Admitting: Internal Medicine

## 2020-06-15 ENCOUNTER — Encounter: Payer: Self-pay | Admitting: Internal Medicine

## 2020-09-22 ENCOUNTER — Other Ambulatory Visit: Payer: Self-pay

## 2020-09-22 ENCOUNTER — Encounter: Payer: Self-pay | Admitting: Internal Medicine

## 2020-09-22 ENCOUNTER — Ambulatory Visit: Payer: BC Managed Care – PPO | Admitting: Internal Medicine

## 2020-09-22 VITALS — BP 116/72 | HR 77 | Temp 98.5°F | Resp 16 | Ht 73.0 in | Wt 227.5 lb

## 2020-09-22 DIAGNOSIS — S8392XA Sprain of unspecified site of left knee, initial encounter: Secondary | ICD-10-CM | POA: Diagnosis not present

## 2020-09-22 NOTE — Patient Instructions (Addendum)
Please schedule your physical exam at your earliest convenience.   IBUPROFEN (Advil or Motrin) 200 mg 2 tablets every 6 hours as needed for pain.  Or Aleve  Always take it with food because may cause gastritis and ulcers.  If you notice nausea, stomach pain, change in the color of stools --->  Stop the medicine and let us know

## 2020-09-22 NOTE — Progress Notes (Signed)
Subjective:    Patient ID: Michael Berg, male    DOB: Jul 20, 1965, 55 y.o.   MRN: [id number]  DOS:  09/22/2020 Type of visit - description: acute About 5 weeks ago, he participated on a company outing, play ball and was very active. Shortly after developed pain at the left knee. The pain is mostly when he crosses his leg or when he tries to get up from a sitting position. Denies any actual injury -fall. The knee was never swollen, warm or red. The area is not TTP   Review of Systems See above   Past Medical History:  Diagnosis Date   Allergy    Anxiety and depression    Hx of anxiety and Depression due to divorce. Not experiencing any concerns at this moment.   Dyslipidemia    Insomnia     Past Surgical History:  Procedure Laterality Date   APPENDECTOMY     age 61    Allergies as of 09/22/2020       Reactions   Augmentin [amoxicillin-pot Clavulanate]    Diarrhea, colitis, see OV 06-18-2019        Medication List        Accurate as of September 22, 2020 11:59 PM. If you have any questions, ask your nurse or doctor.          ALPRAZolam 0.5 MG tablet Commonly known as: XANAX Take 1-2 tablets (0.5-1 mg total) by mouth at bedtime as needed for sleep.   atorvastatin 80 MG tablet Commonly known as: LIPITOR Take 1 tablet (80 mg total) by mouth daily.   dicyclomine 20 MG tablet Commonly known as: BENTYL Take 1 tablet (20 mg total) by mouth 2 (two) times daily.   FLUoxetine 20 MG capsule Commonly known as: PROZAC Take 2 capsules (40 mg total) by mouth daily.   fluticasone 50 MCG/ACT nasal spray Commonly known as: FLONASE Place 2 sprays into both nostrils daily.   MELATONIN GUMMIES PO Take 2 tablets by mouth at bedtime.   meloxicam 15 MG tablet Commonly known as: MOBIC TAKE 1 TABLET BY MOUTH EVERY DAY AS NEEDED FOR PAIN   multivitamin tablet Take 1 tablet by mouth daily.   ondansetron 4 MG disintegrating tablet Commonly known as: Zofran ODT Take 1  tablet (4 mg total) by mouth every 8 (eight) hours as needed for nausea or vomiting.   ZYRTEC ALLERGY PO Take by mouth.           Objective:   Physical Exam BP 116/72 (BP Location: Left Arm, Patient Position: Sitting, Cuff Size: Normal)   Pulse 77   Temp 98.5 F (36.9 C) (Oral)   Resp 16   Ht 6\' 1"  (1.854 m)   Wt 227 lb 8 oz (103.2 kg)   SpO2 98%   BMI 30.02 kg/m  General:   Well developed, NAD, BMI noted. HEENT:  Normocephalic . Face symmetric, atraumatic MSK: R knee: Normal L knee: Normal to inspection and palpation.  Lateral pressure: Knee is stable.  + Pain with forced flexion.  Skin: Not pale. Not jaundice Neurologic:  alert & oriented X3.  Speech normal, gait appropriate for age and unassisted Psych--  Cognition and judgment appear intact.  Cooperative with normal attention span and concentration.  Behavior appropriate. No anxious or depressed appearing.      Assessment     Assessment  (restablished 01-2016) anxiety depression insomnia  (divorce related in the past, sx resurface ~01-2016 work related) Hyperlipidemia  PLAN: Knee sprain: Is  going on for 5 weeks, has taken Aleve regularly without any help.  We agreed on referral to Ortho. Okay to take NSAIDs, GI precautions carefully discussed. Overdue for CPX, encouraged to come back  This visit occurred during the SARS-CoV-2 public health emergency.  Safety protocols were in place, including screening questions prior to the visit, additional usage of staff PPE, and extensive cleaning of exam room while observing appropriate contact time as indicated for disinfecting solutions.

## 2020-09-23 NOTE — Assessment & Plan Note (Signed)
Knee sprain: Is going on for 5 weeks, has taken Aleve regularly without any help.  We agreed on referral to Ortho. Okay to take NSAIDs, GI precautions carefully discussed. Overdue for CPX, encouraged to come back

## 2020-10-09 DIAGNOSIS — U071 COVID-19: Secondary | ICD-10-CM | POA: Diagnosis not present

## 2020-10-24 ENCOUNTER — Encounter: Payer: Self-pay | Admitting: Internal Medicine

## 2020-10-24 ENCOUNTER — Other Ambulatory Visit: Payer: Self-pay

## 2020-10-24 ENCOUNTER — Ambulatory Visit (INDEPENDENT_AMBULATORY_CARE_PROVIDER_SITE_OTHER): Payer: BC Managed Care – PPO | Admitting: Internal Medicine

## 2020-10-24 VITALS — BP 122/84 | HR 74 | Temp 98.3°F | Resp 16 | Ht 73.0 in | Wt 225.2 lb

## 2020-10-24 DIAGNOSIS — Z0000 Encounter for general adult medical examination without abnormal findings: Secondary | ICD-10-CM

## 2020-10-24 DIAGNOSIS — Z79899 Other long term (current) drug therapy: Secondary | ICD-10-CM

## 2020-10-24 DIAGNOSIS — N4289 Other specified disorders of prostate: Secondary | ICD-10-CM

## 2020-10-24 DIAGNOSIS — F341 Dysthymic disorder: Secondary | ICD-10-CM | POA: Diagnosis not present

## 2020-10-24 DIAGNOSIS — E785 Hyperlipidemia, unspecified: Secondary | ICD-10-CM | POA: Diagnosis not present

## 2020-10-24 DIAGNOSIS — Z1159 Encounter for screening for other viral diseases: Secondary | ICD-10-CM | POA: Diagnosis not present

## 2020-10-24 LAB — LIPID PANEL
Cholesterol: 366 mg/dL — ABNORMAL HIGH (ref 0–200)
HDL: 41.8 mg/dL (ref >39.00)
LDL Cholesterol: 296 mg/dL — ABNORMAL HIGH (ref 0–99)
NonHDL: 324.07
Total CHOL/HDL Ratio: 9
Triglycerides: 141 mg/dL (ref 0.0–149.0)
VLDL: 28.2 mg/dL (ref 0.0–40.0)

## 2020-10-24 LAB — COMPREHENSIVE METABOLIC PANEL
ALT: 31 U/L (ref 0–53)
AST: 19 U/L (ref 0–37)
Albumin: 4.5 g/dL (ref 3.5–5.2)
Alkaline Phosphatase: 57 U/L (ref 39–117)
BUN: 13 mg/dL (ref 6–23)
CO2: 29 mEq/L (ref 19–32)
Calcium: 9.8 mg/dL (ref 8.4–10.5)
Chloride: 101 mEq/L (ref 96–112)
Creatinine, Ser: 1.04 mg/dL (ref 0.40–1.50)
GFR: 82.31 mL/min (ref >60.00)
Glucose, Bld: 98 mg/dL (ref 70–99)
Potassium: 4.3 mEq/L (ref 3.5–5.1)
Sodium: 139 mEq/L (ref 135–145)
Total Bilirubin: 0.6 mg/dL (ref 0.2–1.2)
Total Protein: 7.4 g/dL (ref 6.0–8.3)

## 2020-10-24 LAB — PSA: PSA: 4.48 ng/mL — ABNORMAL HIGH (ref 0.10–4.00)

## 2020-10-24 LAB — TSH: TSH: 1.68 u[IU]/mL (ref 0.35–5.50)

## 2020-10-24 LAB — HEMOGLOBIN A1C: Hgb A1c MFr Bld: 5.9 % (ref 4.6–6.5)

## 2020-10-24 MED ORDER — ZOLPIDEM TARTRATE 10 MG PO TABS: 10.0000 mg | ORAL_TABLET | Freq: Every evening | ORAL | 2 refills | Status: DC | PRN

## 2020-10-24 NOTE — Patient Instructions (Addendum)
Proceed with a COVID booster next month  Get a flu shot this fall  Consider tetanus shot called Boostrix in few months  We are referring you to the urologist   [facility] : Get the blood work     [place], [place] back for   a checkup in 6 months

## 2020-10-24 NOTE — Progress Notes (Signed)
Subjective:    Patient ID: Michael Berg, male    DOB: 27-Jul-1965, 55 y.o.   MRN: [id number]  DOS:  10/24/2020 Type of visit - description: CPX  Since the last office visit is doing well. Self stopped fluoxetine and doing well. Knee pain improved (see last visit).   Review of Systems  Other than above, a 14 point review of systems is negative       Past Medical History:  Diagnosis Date   Allergy    Anxiety and depression    Hx of anxiety and Depression due to divorce. Not experiencing any concerns at this moment.   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  Tobacco Use   Smoking status: Former   Smokeless tobacco: Never  Substance and Sexual Activity   Alcohol use: Yes    Comment: socially   Drug use: No   Sexual activity: Yes  Other Topics Concern   Not on file  Social History Narrative   Household- pt and fiancee       Social Determinants of Health   Financial Resource Strain: Not on file  Food Insecurity: Not on file  Transportation Needs: Not on file  Physical Activity: Not on file  Stress: Not on file  Social Connections: Not on file  Intimate Partner Violence: Not on file    Allergies as of 10/24/2020       Reactions   Augmentin [amoxicillin-pot Clavulanate]    Diarrhea, colitis, see OV 06-18-2019        Medication List        Accurate as of October 24, 2020 11:59 PM. If you have any questions, ask your nurse or doctor.          STOP taking these medications    FLUoxetine 20 MG capsule Commonly known as: PROZAC Stopped by: Kathlene November, MD   ondansetron 4 MG disintegrating  tablet Commonly known as: Zofran ODT Stopped by: Kathlene November, MD       TAKE these medications    ALPRAZolam 0.5 MG tablet Commonly known as: XANAX Take 1-2 tablets (0.5-1 mg total) by mouth at bedtime as needed for sleep.   atorvastatin 80 MG tablet Commonly known as: LIPITOR Take 1 tablet (80 mg total) by mouth daily.   dicyclomine 20 MG tablet Commonly known as: BENTYL Take 1 tablet (20 mg total) by mouth 2 (two) times daily.   fluticasone 50 MCG/ACT nasal spray Commonly known as: FLONASE Place 2 sprays into both nostrils daily.   MELATONIN GUMMIES PO Take 2 tablets by mouth at bedtime.   meloxicam 15 MG tablet Commonly known as: MOBIC TAKE 1 TABLET BY MOUTH EVERY DAY AS NEEDED FOR PAIN   multivitamin tablet Take 1 tablet by mouth daily.   zolpidem 10 MG tablet Commonly known as: AMBIEN Take 1 tablet (10 mg total) by mouth at bedtime as needed for sleep. Started by: Kathlene November, MD   ZYRTEC ALLERGY PO Take by mouth.           Objective:   Physical  Exam BP 122/84 (BP Location: Left Arm, Patient Position: Sitting, Cuff Size: Normal)   Pulse 74   Temp 98.3 F (36.8 C) (Oral)   Resp 16   Ht '6\' 1"'$  (1.854 m)   Wt 225 lb 4 oz (102.2 kg)   SpO2 97%   BMI 29.72 kg/m  General: Well developed, NAD, BMI noted Neck: No  thyromegaly  HEENT:  Normocephalic . Face symmetric, atraumatic Lungs:  CTA B Normal respiratory effort, no intercostal retractions, no accessory muscle use. Heart: RRR,  no murmur.  Abdomen:  Not distended, soft, non-tender. No rebound or rigidity.   Lower extremities: no pretibial edema bilaterally DRE: Normal sphincter tone, no stools, prostate not enlarged her tender.  Again I noticed a 4 to 5 mm extraprostatic induration on the right side.  Not tender Skin: Exposed areas without rash. Not pale. Not jaundice Neurologic:  alert & oriented X3.  Speech normal, gait appropriate for age and unassisted Strength symmetric and appropriate for  age.  Psych: Cognition and judgment appear intact.  Cooperative with normal attention span and concentration.  Behavior appropriate. No anxious or depressed appearing.     Assessment     Assessment  (restablished 01-2016) anxiety depression insomnia  (divorce related in the past, sx resurface ~01-2016 work related) Hyperlipidemia FH CAD F age 84 Covid infex 10/10/20, rx paxlovid   PLAN: Here for CPX Anxiety, depression, insomnia: Self stopped fluoxetine and doing well emotionally, needs occasional Xanax.  Does continue to wake up in the middle of the night and request restart Ambien.  Will do. Hyperlipidemia: On Lipitor checking labs. Had a COVID infection recently, he feels fully recuperated Knee sprain: See last visit, self resolved.  Has not seen Ortho. RTC 6 months   This visit occurred during the SARS-CoV-2 public health emergency.  Safety protocols were in place, including screening questions prior to the visit, additional usage of staff PPE, and extensive cleaning of exam room while observing appropriate contact time as indicated for disinfecting solutions.

## 2020-10-25 ENCOUNTER — Encounter: Payer: Self-pay | Admitting: Internal Medicine

## 2020-10-25 LAB — DRUG MONITORING, PANEL 8 WITH CONFIRMATION, URINE
6 Acetylmorphine: NEGATIVE ng/mL (ref <10)
Alcohol Metabolites: NEGATIVE ng/mL (ref <500)
Amphetamines: NEGATIVE ng/mL (ref <500)
Benzodiazepines: NEGATIVE ng/mL (ref <100)
Buprenorphine, Urine: NEGATIVE ng/mL (ref <5)
Cocaine Metabolite: NEGATIVE ng/mL (ref <150)
Creatinine: 53 mg/dL (ref >=20.0)
MDMA: NEGATIVE ng/mL (ref <500)
Marijuana Metabolite: NEGATIVE ng/mL (ref <20)
Opiates: NEGATIVE ng/mL (ref <100)
Oxidant: NEGATIVE ug/mL (ref <200)
Oxycodone: NEGATIVE ng/mL (ref <100)
pH: 6.1 (ref 4.5–9.0)

## 2020-10-25 LAB — HEPATITIS C ANTIBODY
Hepatitis C Ab: NONREACTIVE
SIGNAL TO CUT-OFF: 0.01 (ref <1.00)

## 2020-10-25 LAB — DM TEMPLATE

## 2020-10-25 MED ORDER — ATORVASTATIN CALCIUM 80 MG PO TABS: 80.0000 mg | ORAL_TABLET | Freq: Every day | ORAL | 1 refills | Status: DC

## 2020-10-25 NOTE — Assessment & Plan Note (Signed)
Here for CPX Anxiety, depression, insomnia: Self stopped fluoxetine and doing well emotionally, needs occasional Xanax.  Does continue to wake up in the middle of the night and request restart Ambien.  Will do. Hyperlipidemia: On Lipitor checking labs. Had a COVID infection recently, he feels fully recuperated Knee sprain: See last visit, self resolved.  Has not seen Ortho. RTC 6 months

## 2020-10-25 NOTE — Assessment & Plan Note (Signed)
-  Td 2012.  Booster in few months -s/p shingrex x 1 , shingrix # 2 @ [facility] ([place]) - covid vax x 2 , rec a booster next month - flu shot : recommended  --CCS: never had a cscope, neg cologuard 06/2019 -prostate ca screening : DRE again noted 4 to 5 mm extraprostatic induration on the proximal-right side, asymptomatic, check PSA.  Refer to urology. -Diet and exercise discussed -Labs:  CMP, FLP, A1c, TSH, PSA, hep C, UDS

## 2020-10-27 ENCOUNTER — Telehealth: Payer: Self-pay

## 2020-10-27 NOTE — Telephone Encounter (Signed)
Pt called back- we discussed results and recommendations. Lab appt scheduled 02/02/2021.

## 2020-10-27 NOTE — Addendum Note (Signed)
Addended byDamita Dunnings D on: 10/27/2020 08:31 AM   Modules accepted: Orders

## 2020-11-10 DIAGNOSIS — R972 Elevated prostate specific antigen [PSA]: Secondary | ICD-10-CM | POA: Diagnosis not present

## 2020-11-10 DIAGNOSIS — N402 Nodular prostate without lower urinary tract symptoms: Secondary | ICD-10-CM | POA: Diagnosis not present

## 2020-12-23 DIAGNOSIS — C61 Malignant neoplasm of prostate: Secondary | ICD-10-CM

## 2020-12-23 HISTORY — DX: Malignant neoplasm of prostate: C61

## 2021-01-16 DIAGNOSIS — R972 Elevated prostate specific antigen [PSA]: Secondary | ICD-10-CM | POA: Diagnosis not present

## 2021-01-16 DIAGNOSIS — D075 Carcinoma in situ of prostate: Secondary | ICD-10-CM | POA: Diagnosis not present

## 2021-01-16 DIAGNOSIS — C61 Malignant neoplasm of prostate: Secondary | ICD-10-CM | POA: Diagnosis not present

## 2021-01-22 DIAGNOSIS — C61 Malignant neoplasm of prostate: Secondary | ICD-10-CM | POA: Diagnosis not present

## 2021-01-24 ENCOUNTER — Telehealth: Payer: Self-pay | Admitting: Radiation Oncology

## 2021-01-25 ENCOUNTER — Telehealth: Payer: Self-pay | Admitting: Radiation Oncology

## 2021-01-26 ENCOUNTER — Telehealth: Payer: Self-pay | Admitting: Radiation Oncology

## 2021-01-29 ENCOUNTER — Telehealth: Payer: Self-pay | Admitting: Radiation Oncology

## 2021-01-30 ENCOUNTER — Other Ambulatory Visit: Payer: Self-pay | Admitting: Urology

## 2021-01-30 DIAGNOSIS — C61 Malignant neoplasm of prostate: Secondary | ICD-10-CM

## 2021-01-31 NOTE — Progress Notes (Signed)
GU Location of Tumor / Histology: Prostate Ca  If Prostate Cancer, Gleason Score is (3 + 3) and PSA is (4.48 as of 10/22)  Biopsies  Dr. Claudia Desanctis       Past/Anticipated interventions by urology, if any:   Past/Anticipated interventions by medical oncology, if any:   Weight changes, if any: No stable  IPSS:  0 SHIM:  23  Bowel/Bladder complaints, if any:  No bladder or bowel issues.  Nausea/Vomiting, if any:  No   Pain issues, if any:  0/10  SAFETY ISSUES: Prior radiation?  No Pacemaker/ICD?  No Possible current pregnancy?  Male Is the patient on methotrexate?  No  Current Complaints / other details:  Needing more information about treatment options. Fear of the unknown.

## 2021-02-02 ENCOUNTER — Ambulatory Visit
Admission: RE | Admit: 2021-02-02 | Discharge: 2021-02-02 | Disposition: A | Discharge status: Home / Self Care | Payer: BC Managed Care – PPO | Source: Ambulatory Visit | Attending: Radiation Oncology | Admitting: Radiation Oncology

## 2021-02-02 ENCOUNTER — Other Ambulatory Visit (INDEPENDENT_AMBULATORY_CARE_PROVIDER_SITE_OTHER): Payer: BC Managed Care – PPO

## 2021-02-02 ENCOUNTER — Encounter: Payer: Self-pay | Admitting: Radiation Oncology

## 2021-02-02 ENCOUNTER — Other Ambulatory Visit: Payer: Self-pay

## 2021-02-02 VITALS — BP 142/82 | HR 93 | Temp 97.3°F | Resp 20 | Wt 227.2 lb

## 2021-02-02 DIAGNOSIS — C61 Malignant neoplasm of prostate: Secondary | ICD-10-CM | POA: Insufficient documentation

## 2021-02-02 DIAGNOSIS — E785 Hyperlipidemia, unspecified: Secondary | ICD-10-CM | POA: Diagnosis not present

## 2021-02-02 LAB — LIPID PANEL
Cholesterol: 181 mg/dL (ref 0–200)
HDL: 38.6 mg/dL — ABNORMAL LOW (ref >39.00)
LDL Cholesterol: 129 mg/dL — ABNORMAL HIGH (ref 0–99)
NonHDL: 142.44
Total CHOL/HDL Ratio: 5
Triglycerides: 65 mg/dL (ref 0.0–149.0)
VLDL: 13 mg/dL (ref 0.0–40.0)

## 2021-02-02 NOTE — Progress Notes (Signed)
Introduced myself to patient as the prostate nurse navigator and discussed my role.  No barriers to care identified at this time.  He is here to discuss his radiation treatment options.  I gave him my business card and asked him to call me with questions or concerns.  Verbalized understanding.   

## 2021-02-02 NOTE — Progress Notes (Signed)
Radiation Oncology         [phone] ________________________________  Initial Outpatient Consultation  Name: Michael Berg MRN: [id number]  Date: 02/02/2021  DOB: 21-Sep-1965  CC:Paz, Alda Berthold, MD  Robley Fries, MD   REFERRING PHYSICIAN: Robley Fries, MD  DIAGNOSIS: 55 y.o. gentleman with Stage T2a adenocarcinoma of the prostate with Gleason score of 3+3, and PSA of 3.81.    ICD-10-CM   1. Malignant neoplasm of prostate ([place])  C61       HISTORY OF PRESENT ILLNESS: Michael Berg is a 55 y.o. male with a diagnosis of prostate cancer. He was noted to have an elevated PSA of 4.48 and a prostate nodule on digital rectal examination, by his primary care physician, Dr. Larose Kells.  A prior PSA was 2.76 in 2021. Accordingly, he was referred for evaluation in urology by Dr. Claudia Desanctis on 11/10/20,  digital rectal examination was performed at that time revealing a subtle <1 cm nodule in the left apex. Repeat PSA performed that day showed decrease to 3.81. The patient proceeded to transrectal ultrasound with 12 biopsies of the prostate on 01/16/21.  The prostate volume measured 42.81 cc.  Out of 12 core biopsies, 4 were positive.  The maximum Gleason score was 3+3, and this was seen in the left mid (small focus), left apex, right mid, and right apex lateral.  The patient reviewed the biopsy results with his urologist and he has kindly been referred today for discussion of potential radiation treatment options.   PREVIOUS RADIATION THERAPY: No  PAST MEDICAL HISTORY:  Past Medical History:  Diagnosis Date   Allergy    Anxiety and depression    Hx of anxiety and Depression due to divorce. Not experiencing any concerns at this moment.   Dyslipidemia    Insomnia       PAST SURGICAL HISTORY: Past Surgical History:  Procedure Laterality Date   APPENDECTOMY     age 61    FAMILY HISTORY:  Family History  Problem Relation Age of Onset   Coronary artery disease Father        CABG at  age 71   Colon cancer Other        uncle dx at age 65 (?)   Diabetes Paternal Grandmother    Heart defect Brother        no CAD, open heart surgery as an adult   Prostate cancer Neg Hx     SOCIAL HISTORY:  Social History   Socioeconomic History   Marital status: Divorced    Spouse name: Not on file   Number of children: 2   Years of education: Not on file   Highest education level: Not on file  Occupational History   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  Tobacco Use   Smoking status: Former   Smokeless tobacco: Never  Substance and Sexual Activity   Alcohol use: Yes    Comment: socially   Drug use: No   Sexual activity: Yes  Other Topics Concern   Not on file  Social History Narrative   Household- pt and fiancee       Social Determinants of Health   Financial Resource Strain: Not on file  Food Insecurity: Not on file  Transportation Needs: Not on file  Physical Activity: Not on file  Stress: Not on file  Social Connections: Not on file  Intimate Partner Violence: Not on file    ALLERGIES: Augmentin [amoxicillin-pot clavulanate]  MEDICATIONS:  Current Outpatient Medications  Medication Sig Dispense Refill   ALPRAZolam (XANAX) 0.5 MG tablet Take 1-2 tablets (0.5-1 mg total) by mouth at bedtime as needed for sleep. 60 tablet 0   atorvastatin (LIPITOR) 80 MG tablet Take 1 tablet (80 mg total) by mouth daily. 90 tablet 1   Cetirizine HCl (ZYRTEC ALLERGY PO) Take by mouth.     fluticasone (FLONASE) 50 MCG/ACT nasal spray Place 2 sprays into both nostrils daily. 16 g 6   Multiple Vitamin (MULTIVITAMIN) tablet Take 1 tablet by mouth daily.       dicyclomine (BENTYL) 20 MG tablet Take 1 tablet (20 mg total) by mouth 2 (two) times daily. (Patient not taking: Reported on 02/02/2021) 20 tablet 0   MELATONIN GUMMIES PO Take 2 tablets by mouth at bedtime. (Patient not taking: Reported on 02/02/2021)     meloxicam (MOBIC) 15 MG tablet TAKE  1 TABLET BY MOUTH EVERY DAY AS NEEDED FOR PAIN (Patient not taking: No sig reported) 30 tablet 1   zolpidem (AMBIEN) 10 MG tablet Take 1 tablet (10 mg total) by mouth at bedtime as needed for sleep. 30 tablet 2   No current facility-administered medications for this encounter.    REVIEW OF SYSTEMS:  On review of systems, the patient reports that he is doing well overall. He denies any chest pain, shortness of breath, cough, fevers, chills, night sweats, unintended weight changes. He denies any bowel disturbances, and denies abdominal pain, nausea or vomiting. He denies any new musculoskeletal or joint aches or pains. His IPSS was 0, indicating no urinary symptoms. His SHIM was 23, indicating he does not have erectile dysfunction. A complete review of systems is obtained and is otherwise negative.    PHYSICAL EXAM:  Wt Readings from Last 3 Encounters:  02/02/21 227 lb 3.2 oz (103.1 kg)  10/24/20 225 lb 4 oz (102.2 kg)  09/22/20 227 lb 8 oz (103.2 kg)   Temp Readings from Last 3 Encounters:  02/02/21 (!) 97.3 F (36.3 C)  10/24/20 98.3 F (36.8 C) (Oral)  09/22/20 98.5 F (36.9 C) (Oral)   BP Readings from Last 3 Encounters:  02/02/21 (!) 142/82  10/24/20 122/84  09/22/20 116/72   Pulse Readings from Last 3 Encounters:  02/02/21 93  10/24/20 74  09/22/20 77   Pain Assessment Pain Score: 0-No pain/10  In general this is a well appearing African American male in no acute distress. He's alert and oriented x4 and appropriate throughout the examination. Cardiopulmonary assessment is negative for acute distress, and he exhibits normal effort.    KPS = 100  100 - Normal; no complaints; no evidence of disease. 90   - Able to carry on normal activity; minor signs or symptoms of disease. 80   - Normal activity with effort; some signs or symptoms of disease. 32   - Cares for self; unable to carry on normal activity or to do active work. 60   - Requires occasional assistance, but is  able to care for most of his personal needs. 50   - Requires considerable assistance and frequent medical care. 84   - Disabled; requires special care and assistance. 57   - Severely disabled; hospital admission is indicated although death not imminent. 25   - Very sick; hospital admission necessary; active supportive treatment necessary. 10   - Moribund; fatal processes progressing rapidly. 0     - Dead  Karnofsky DA, Abelmann WH, Craver LS and Burchenal [facility] [phone]) The  use of the nitrogen mustards in the palliative treatment of carcinoma: with particular reference to bronchogenic carcinoma Cancer 1 634-56  LABORATORY DATA:  Lab Results  Component Value Date   WBC 10.7 (H) 06/14/2019   HGB 13.2 06/14/2019   HCT 40.2 06/14/2019   MCV 88.7 06/14/2019   PLT 219 06/14/2019   Lab Results  Component Value Date   NA 139 10/24/2020   K 4.3 10/24/2020   CL 101 10/24/2020   CO2 29 10/24/2020   Lab Results  Component Value Date   ALT 31 10/24/2020   AST 19 10/24/2020   ALKPHOS 57 10/24/2020   BILITOT 0.6 10/24/2020     RADIOGRAPHY: No results found.    IMPRESSION/PLAN: 1. 55 y.o. gentleman with Stage T2a adenocarcinoma of the prostate with Gleason Score of 3+3, and PSA of 3.81. We discussed the patient's workup and outlined the nature of prostate cancer in this setting. The patient's T stage, Gleason's score, and PSA put him into the low risk group. Accordingly, he is eligible for a variety of potential treatment options including active surveillance, brachytherapy, 5.5 weeks of external radiation, or prostatectomy. We discussed the available radiation techniques, and focused on the details and logistics of delivery. We discussed and outlined the risks, benefits, short and long-term effects associated with radiotherapy and compared and contrasted these with prostatectomy. We discussed the role of SpaceOAR gel in reducing the rectal toxicity associated with radiotherapy. He appears to have a  good understanding of his disease and our treatment recommendations which are of curative intent.  He was encouraged to ask questions that were answered to his stated satisfaction.  At the conclusion of our conversation, the patient is interested in moving forward with active surveillance for now and would consider brachytherapy if there is any indication of disease progression while on surveillance.  He would like to schedule a prostate MRI in 4 months with PSA prior to a follow up visit with Dr. Claudia Desanctis and then will make a decision regarding proceeding with treatment versus active surveillance at that time. We will share our discussion with Dr. Claudia Desanctis and look forward to continuing to follow his progress via correspondence. He has our contact information and knows that he is welcome to call at any time with further questions or concerns regarding radiation. We enjoyed meeting him today and would be more than happy to continue to participate in his care if clinically indicated in the future.  We personally spent 70 minutes in this encounter including chart review, reviewing radiological studies, meeting face-to-face with the patient, entering orders and completing documentation.    Nicholos Johns, PA-C    Tyler Pita, MD  [facility] Direct Dial: [phone]  Fax: [phone] [email or web address]  Skype  LinkedIn   This document serves as a record of services personally performed by Tyler Pita, MD and Freeman Caldron, PA-C. It was created on their behalf by Wilburn Mylar, a trained medical scribe. The creation of this record is based on the scribe's personal observations and the provider's statements to them. This document has been checked and approved by the attending provider.

## 2021-02-06 ENCOUNTER — Telehealth: Payer: Self-pay

## 2021-02-06 DIAGNOSIS — C61 Malignant neoplasm of prostate: Secondary | ICD-10-CM | POA: Insufficient documentation

## 2021-02-06 NOTE — Telephone Encounter (Signed)
Erroneous entry

## 2021-04-17 ENCOUNTER — Ambulatory Visit
Admission: RE | Admit: 2021-04-17 | Discharge: 2021-04-17 | Disposition: A | Discharge status: Home / Self Care | Payer: BC Managed Care – PPO | Source: Ambulatory Visit | Attending: Urology | Admitting: Urology

## 2021-04-17 ENCOUNTER — Other Ambulatory Visit: Payer: Self-pay

## 2021-04-17 DIAGNOSIS — K5730 Diverticulosis of large intestine without perforation or abscess without bleeding: Secondary | ICD-10-CM | POA: Diagnosis not present

## 2021-04-17 DIAGNOSIS — C61 Malignant neoplasm of prostate: Secondary | ICD-10-CM

## 2021-04-17 DIAGNOSIS — N402 Nodular prostate without lower urinary tract symptoms: Secondary | ICD-10-CM | POA: Diagnosis not present

## 2021-04-17 MED ORDER — GADOBENATE DIMEGLUMINE 529 MG/ML IV SOLN
20.0000 mL | Freq: Once | INTRAVENOUS | Status: AC | PRN
  Administered 2021-04-17: 20 mL via INTRAVENOUS

## 2021-04-22 ENCOUNTER — Other Ambulatory Visit: Payer: Self-pay | Admitting: Internal Medicine

## 2021-04-23 DIAGNOSIS — C61 Malignant neoplasm of prostate: Secondary | ICD-10-CM

## 2021-04-24 ENCOUNTER — Encounter: Payer: Self-pay | Admitting: Licensed Clinical Social Worker

## 2021-04-24 ENCOUNTER — Encounter: Payer: Self-pay | Admitting: General Practice

## 2021-04-24 NOTE — Progress Notes (Signed)
[place] Work  Clinical Social Work was referred by Engineer, site for assessment of psychosocial needs.  Clinical Social Worker contacted patient by phone  to offer support and assess for needs.   Patient reports no immediate resource needs at this time with food, transportation, or housing. He is concerned about what the medical costs may be as he "doesn't like being blindsided".  CSW shared information on [facility] which may be able to provide some limited assistance.  Encouraged patient to reach out if any other needs arise.  Pt is also connecting with L. [place] for coping support.     [place], [place] Worker [employer]

## 2021-04-24 NOTE — Progress Notes (Signed)
[place] Spiritual Care Note  Reached Mr Hanway by phone per referral from prostate navigator Becky Sax. Introduced [place] and [employer] as part of his team and support resources. He plans to consult his calendar and contact me back with suggested times to meet for a Spiritual Care appointment in my office, where he will have a private place to share and process his story, feelings, and gleanings so far.   [place], [place], [facility] Pager [phone] Voicemail [phone]

## 2021-04-27 ENCOUNTER — Encounter: Payer: Self-pay | Admitting: Internal Medicine

## 2021-04-27 ENCOUNTER — Other Ambulatory Visit (HOSPITAL_BASED_OUTPATIENT_CLINIC_OR_DEPARTMENT_OTHER): Payer: Self-pay

## 2021-04-27 ENCOUNTER — Ambulatory Visit: Payer: BC Managed Care – PPO | Attending: Internal Medicine

## 2021-04-27 ENCOUNTER — Ambulatory Visit: Payer: BC Managed Care – PPO | Admitting: Internal Medicine

## 2021-04-27 VITALS — BP 116/64 | HR 59 | Temp 98.2°F | Resp 16 | Ht 73.0 in | Wt 228.4 lb

## 2021-04-27 DIAGNOSIS — M25562 Pain in left knee: Secondary | ICD-10-CM | POA: Diagnosis not present

## 2021-04-27 DIAGNOSIS — Z23 Encounter for immunization: Secondary | ICD-10-CM | POA: Diagnosis not present

## 2021-04-27 DIAGNOSIS — E785 Hyperlipidemia, unspecified: Secondary | ICD-10-CM | POA: Diagnosis not present

## 2021-04-27 DIAGNOSIS — C61 Malignant neoplasm of prostate: Secondary | ICD-10-CM

## 2021-04-27 DIAGNOSIS — F5101 Primary insomnia: Secondary | ICD-10-CM

## 2021-04-27 LAB — COMPREHENSIVE METABOLIC PANEL
ALT: 53 U/L (ref 0–53)
AST: 31 U/L (ref 0–37)
Albumin: 4.3 g/dL (ref 3.5–5.2)
Alkaline Phosphatase: 59 U/L (ref 39–117)
BUN: 17 mg/dL (ref 6–23)
CO2: 34 mEq/L — ABNORMAL HIGH (ref 19–32)
Calcium: 9.5 mg/dL (ref 8.4–10.5)
Chloride: 106 mEq/L (ref 96–112)
Creatinine, Ser: 1.12 mg/dL (ref 0.40–1.50)
GFR: 75.04 mL/min (ref >60.00)
Glucose, Bld: 108 mg/dL — ABNORMAL HIGH (ref 70–99)
Potassium: 4.8 mEq/L (ref 3.5–5.1)
Sodium: 143 mEq/L (ref 135–145)
Total Bilirubin: 0.6 mg/dL (ref 0.2–1.2)
Total Protein: 6.9 g/dL (ref 6.0–8.3)

## 2021-04-27 LAB — CBC WITH DIFFERENTIAL/PLATELET
Basophils Absolute: 0 10*3/uL (ref 0.0–0.1)
Basophils Relative: 0.3 % (ref 0.0–3.0)
Eosinophils Absolute: 0.1 10*3/uL (ref 0.0–0.7)
Eosinophils Relative: 2.3 % (ref 0.0–5.0)
HCT: 43.2 % (ref 39.0–52.0)
Hemoglobin: 14.3 g/dL (ref 13.0–17.0)
Lymphocytes Relative: 30.6 % (ref 12.0–46.0)
Lymphs Abs: 1.4 10*3/uL (ref 0.7–4.0)
MCHC: 33.2 g/dL (ref 30.0–36.0)
MCV: 86.7 fl (ref 78.0–100.0)
Monocytes Absolute: 0.4 10*3/uL (ref 0.1–1.0)
Monocytes Relative: 9.5 % (ref 3.0–12.0)
Neutro Abs: 2.5 10*3/uL (ref 1.4–7.7)
Neutrophils Relative %: 57.3 % (ref 43.0–77.0)
Platelets: 223 10*3/uL (ref 150.0–400.0)
RBC: 4.98 Mil/uL (ref 4.22–5.81)
RDW: 12.8 % (ref 11.5–15.5)
WBC: 4.5 10*3/uL (ref 4.0–10.5)

## 2021-04-27 LAB — LIPID PANEL
Cholesterol: 164 mg/dL (ref 0–200)
HDL: 40.6 mg/dL (ref >39.00)
LDL Cholesterol: 111 mg/dL — ABNORMAL HIGH (ref 0–99)
NonHDL: 123.55
Total CHOL/HDL Ratio: 4
Triglycerides: 62 mg/dL (ref 0.0–149.0)
VLDL: 12.4 mg/dL (ref 0.0–40.0)

## 2021-04-27 MED ORDER — PFIZER COVID-19 VAC BIVALENT 30 MCG/0.3ML IM SUSP
INTRAMUSCULAR | 0 refills | Status: DC
  Filled 2021-04-27: qty 0.3, 1d supply, fill #0

## 2021-04-27 MED ORDER — ZOLPIDEM TARTRATE 10 MG PO TABS: 10.0000 mg | ORAL_TABLET | Freq: Every evening | ORAL | 3 refills | Status: DC | PRN

## 2021-04-27 NOTE — Assessment & Plan Note (Signed)
Prostate cancer: Diagnosed October 2022, after extensive discussion with urology and other specialties he has decided to proceed with radiation therapy.  Emotionally doing well. Hyperlipidemia: + FH CAD, responding well to Lipitor, he is fasting, we agreed to recheck FLP, CMP and CBC.  He is doing better with diet. Left knee pain: Symptoms have resurfaced, ongoing, request a referral.  Will do. Insomnia: ambien RF, pdmp ok Preventive care: Flu shot today, encouraged to proceed with COVID booster, he is open to the idea.  Tdap at the next opportunity RTC 6 months CPX

## 2021-04-27 NOTE — Progress Notes (Signed)
° °  Covid-19 Vaccination Clinic  Name:  Michael Berg    MRN: [id number] DOB: [birth year]  04/27/2021  [name] was observed post Covid-19 immunization for 15 minutes without incident. He was provided with Vaccine Information Sheet and instruction to access the V-Safe system.   [name] was instructed to call 911 with any severe reactions post vaccine: Difficulty breathing  Swelling of face and throat  A fast heartbeat  A bad rash all over body  Dizziness and weakness   Immunizations Administered     Name Date Dose VIS Date Route   Pfizer Covid-19 Vaccine Bivalent Booster 04/27/2021  8:54 AM 0.3 mL 11/22/2020 Intramuscular   Manufacturer: [place]   Lot: [id number]   [place]: [phone]

## 2021-04-27 NOTE — Patient Instructions (Signed)
   GO TO THE LAB : Get the blood work     GO TO THE FRONT DESK, PLEASE SCHEDULE YOUR APPOINTMENTS Come back for a physical exam in 6 months 

## 2021-04-27 NOTE — Progress Notes (Signed)
Michael Berg is a better of well healed number  Subjective:    Patient ID: Michael Berg, male    DOB: 1965-10-31, 56 y.o.   MRN: [id number]  DOS:  04/27/2021 Type of visit - description: Routine checkup  Since the last visit he was diagnosed with prostate cancer. We also talk about left knee pain, high cholesterol.  Review of Systems Has changed his diet, trying to eat healthier.  Past Medical History:  Diagnosis Date   Allergy    Anxiety and depression    Hx of anxiety and Depression due to divorce. Not experiencing any concerns at this moment.   Dyslipidemia    Insomnia     Past Surgical History:  Procedure Laterality Date   APPENDECTOMY     age 83    Current Outpatient Medications  Medication Instructions   atorvastatin (LIPITOR) 80 MG tablet TAKE 1 TABLET BY MOUTH EVERY DAY   Cetirizine HCl (ZYRTEC ALLERGY PO) Oral   fluticasone (FLONASE) 50 MCG/ACT nasal spray 2 sprays, Each Nare, Daily   Multiple Vitamin (MULTIVITAMIN) tablet 1 tablet, Daily   zolpidem (AMBIEN) 10 mg, Oral, At bedtime PRN       Objective:   Physical Exam BP 116/64 (BP Location: Left Arm, Patient Position: Sitting, Cuff Size: Normal)    Pulse (!) 59    Temp 98.2 F (36.8 C) (Oral)    Resp 16    Ht 6\' 1"  (1.854 m)    Wt 228 lb 6 oz (103.6 kg)    SpO2 97%    BMI 30.13 kg/m  General:   Well developed, NAD, BMI noted. HEENT:  Normocephalic . Face symmetric, atraumatic Lungs:  CTA B Normal respiratory effort, no intercostal retractions, no accessory muscle use. Heart: RRR,  no murmur.  Lower extremities: no pretibial edema bilaterally  Skin: Not pale. Not jaundice Neurologic:  alert & oriented X3.  Speech normal, gait appropriate for age and unassisted Psych--  Cognition and judgment appear intact.  Cooperative with normal attention span and concentration.  Behavior appropriate. No anxious or depressed appearing.      Assessment     Assessment  (restablished 01-2016) anxiety  depression insomnia  (divorce related in the past, sx resurface ~01-2016 work related) Hyperlipidemia FH CAD F age 46 Prostate ca 12-2020  PLAN: Prostate cancer: Diagnosed October 2022, after extensive discussion with urology and other specialties he has decided to proceed with radiation therapy.  Emotionally doing well. Hyperlipidemia: + FH CAD, responding well to Lipitor, he is fasting, we agreed to recheck FLP, CMP and CBC.  He is doing better with diet. Left knee pain: Symptoms have resurfaced, ongoing, request a referral.  Will do. Insomnia: ambien RF, pdmp ok Preventive care: Flu shot today, encouraged to proceed with COVID booster, he is open to the idea.  Tdap at the next opportunity RTC 6 months CPX    This visit occurred during the SARS-CoV-2 public health emergency.  Safety protocols were in place, including screening questions prior to the visit, additional usage of staff PPE, and extensive cleaning of exam room while observing appropriate contact time as indicated for disinfecting solutions.

## 2021-05-02 ENCOUNTER — Other Ambulatory Visit: Payer: Self-pay | Admitting: Urology

## 2021-05-09 ENCOUNTER — Encounter: Payer: Self-pay | Admitting: Internal Medicine

## 2021-05-11 ENCOUNTER — Encounter: Payer: Self-pay | Admitting: Radiation Oncology

## 2021-05-14 NOTE — Progress Notes (Signed)
RN returned called and provided information and education regarding upcoming appointments.  Pt verbalized concerns on cost of treatment, will contact financial advocates to reach out to patient.  Pt verbalized understanding.

## 2021-05-17 DIAGNOSIS — H5203 Hypermetropia, bilateral: Secondary | ICD-10-CM | POA: Diagnosis not present

## 2021-05-24 ENCOUNTER — Encounter (HOSPITAL_BASED_OUTPATIENT_CLINIC_OR_DEPARTMENT_OTHER): Payer: Self-pay | Admitting: Urology

## 2021-05-24 NOTE — Progress Notes (Signed)
Left message for pt earlier today to do pre-op interview for surgery scheduled for 05-29-2021 by Dr Claudia Desanctis.  Pt called back and stated he will need to reschedule due to insurance change,  stated he left message with scheduler at Dr Claudia Desanctis office. ?

## 2021-05-28 ENCOUNTER — Encounter: Payer: Self-pay | Admitting: Internal Medicine

## 2021-06-05 ENCOUNTER — Ambulatory Visit: Payer: BC Managed Care – PPO | Admitting: Radiation Oncology

## 2021-06-08 DIAGNOSIS — M25562 Pain in left knee: Secondary | ICD-10-CM | POA: Diagnosis not present

## 2021-07-18 ENCOUNTER — Other Ambulatory Visit: Payer: Self-pay

## 2021-07-18 ENCOUNTER — Encounter (HOSPITAL_BASED_OUTPATIENT_CLINIC_OR_DEPARTMENT_OTHER): Payer: Self-pay | Admitting: Urology

## 2021-07-18 ENCOUNTER — Other Ambulatory Visit: Payer: Self-pay | Admitting: Urology

## 2021-07-18 NOTE — Progress Notes (Signed)
Spoke w/ via phone for pre-op interview--- pt ?Lab needs dos----   no            ?Lab results------ no ?COVID test -----patient states asymptomatic no test needed ?Arrive at ------- 0830 on 07-20-2021 ?NPO after MN NO Solid Food.  Clear liquids from MN until--- 0730 ?Med rec completed ?Medications to take morning of surgery ----- none ?Diabetic medication ----- n/a ?Patient instructed no nail polish to be worn day of surgery ?Patient instructed to bring photo id and insurance card day of surgery ?Patient aware to have Driver (ride ) / caregiver for 24 hours after surgery ---- sig other, melissa  ?Patient Special Instructions ----- will do one fleet enema night before surgery ?Pre-Op special Istructions ----- called and left message w/ OR scheduler to redo pre-op orders since surgeon has changed to Dr Louis Meckel and not Dr Claudia Desanctis ?Patient verbalized understanding of instructions that were given at this phone interview. ?Patient denies shortness of breath, chest pain, fever, cough at this phone interview.  ?

## 2021-07-20 ENCOUNTER — Ambulatory Visit (HOSPITAL_BASED_OUTPATIENT_CLINIC_OR_DEPARTMENT_OTHER): Payer: BC Managed Care – PPO | Admitting: Anesthesiology

## 2021-07-20 ENCOUNTER — Encounter (HOSPITAL_BASED_OUTPATIENT_CLINIC_OR_DEPARTMENT_OTHER): Payer: Self-pay | Admitting: Urology

## 2021-07-20 ENCOUNTER — Ambulatory Visit (HOSPITAL_BASED_OUTPATIENT_CLINIC_OR_DEPARTMENT_OTHER)
Admission: RE | Admit: 2021-07-20 | Discharge: 2021-07-20 | Disposition: A | Discharge status: Home / Self Care | Payer: BC Managed Care – PPO | Attending: Urology | Admitting: Urology

## 2021-07-20 ENCOUNTER — Encounter (HOSPITAL_BASED_OUTPATIENT_CLINIC_OR_DEPARTMENT_OTHER)
Admission: RE | Disposition: A | Discharge status: Home / Self Care | Payer: Self-pay | Source: Home / Self Care | Attending: Urology

## 2021-07-20 DIAGNOSIS — Z87891 Personal history of nicotine dependence: Secondary | ICD-10-CM | POA: Insufficient documentation

## 2021-07-20 DIAGNOSIS — C61 Malignant neoplasm of prostate: Secondary | ICD-10-CM | POA: Insufficient documentation

## 2021-07-20 DIAGNOSIS — G20 Parkinson's disease: Secondary | ICD-10-CM | POA: Insufficient documentation

## 2021-07-20 DIAGNOSIS — F418 Other specified anxiety disorders: Secondary | ICD-10-CM | POA: Diagnosis not present

## 2021-07-20 HISTORY — PX: SPACE OAR INSTILLATION: SHX6769

## 2021-07-20 HISTORY — PX: GOLD SEED IMPLANT: SHX6343

## 2021-07-20 SURGERY — INSERTION, GOLD SEEDS
Anesthesia: Monitor Anesthesia Care | Site: Prostate

## 2021-07-20 MED ORDER — FENTANYL CITRATE (PF) 100 MCG/2ML IJ SOLN
INTRAMUSCULAR | Status: DC | PRN
  Administered 2021-07-20: 50 ug via INTRAVENOUS

## 2021-07-20 MED ORDER — PROPOFOL 10 MG/ML IV BOLUS
INTRAVENOUS | Status: DC | PRN
  Administered 2021-07-20: 20 mg via INTRAVENOUS

## 2021-07-20 MED ORDER — FLEET ENEMA 7-19 GM/118ML RE ENEM: 1.0000 | ENEMA | Freq: Once | RECTAL | Status: DC

## 2021-07-20 MED ORDER — SODIUM CHLORIDE (PF) 0.9 % IJ SOLN
INTRAMUSCULAR | Status: DC | PRN
  Administered 2021-07-20: 10 mL via INTRAVENOUS

## 2021-07-20 MED ORDER — ONDANSETRON HCL 4 MG/2ML IJ SOLN
INTRAMUSCULAR | Status: DC | PRN
  Administered 2021-07-20: 4 mg via INTRAVENOUS

## 2021-07-20 MED ORDER — ACETAMINOPHEN 500 MG PO TABS
1000.0000 mg | ORAL_TABLET | Freq: Once | ORAL | Status: AC
Start: 2021-07-20 — End: 2021-07-20
  Administered 2021-07-20: 1000 mg via ORAL

## 2021-07-20 MED ORDER — LACTATED RINGERS IV SOLN: INTRAVENOUS | Status: DC

## 2021-07-20 MED ORDER — CEFAZOLIN SODIUM-DEXTROSE 2-4 GM/100ML-% IV SOLN
2.0000 g | INTRAVENOUS | Status: AC
  Administered 2021-07-20: 2 g via INTRAVENOUS

## 2021-07-20 MED ORDER — MIDAZOLAM HCL 2 MG/2ML IJ SOLN
INTRAMUSCULAR | Status: AC
  Filled 2021-07-20: qty 2

## 2021-07-20 MED ORDER — PROPOFOL 500 MG/50ML IV EMUL
INTRAVENOUS | Status: DC | PRN
Start: 2021-07-20 — End: 2021-07-20
  Administered 2021-07-20: 200 ug/kg/min via INTRAVENOUS

## 2021-07-20 MED ORDER — ACETAMINOPHEN 500 MG PO TABS
ORAL_TABLET | ORAL | Status: AC
  Filled 2021-07-20: qty 2

## 2021-07-20 MED ORDER — MIDAZOLAM HCL 5 MG/5ML IJ SOLN
INTRAMUSCULAR | Status: DC | PRN
Start: 2021-07-20 — End: 2021-07-20
  Administered 2021-07-20: 2 mg via INTRAVENOUS

## 2021-07-20 MED ORDER — CEFAZOLIN SODIUM-DEXTROSE 2-4 GM/100ML-% IV SOLN
INTRAVENOUS | Status: AC
  Filled 2021-07-20: qty 100

## 2021-07-20 MED ORDER — LIDOCAINE HCL 1 % IJ SOLN
INTRAMUSCULAR | Status: DC | PRN
  Administered 2021-07-20: 9 mL

## 2021-07-20 MED ORDER — FENTANYL CITRATE (PF) 100 MCG/2ML IJ SOLN
INTRAMUSCULAR | Status: AC
  Filled 2021-07-20: qty 2

## 2021-07-20 SURGICAL SUPPLY — 26 items
BLADE CLIPPER SENSICLIP SURGIC (BLADE) ×2 IMPLANT
CNTNR URN SCR LID CUP LEK RST (MISCELLANEOUS) ×1 IMPLANT
CONT SPEC 4OZ STRL OR WHT (MISCELLANEOUS) ×2
COVER BACK TABLE 60X90IN (DRAPES) ×2 IMPLANT
DRSG IV TEGADERM 3.5X4.5 STRL (GAUZE/BANDAGES/DRESSINGS) ×1 IMPLANT
DRSG TEGADERM 4X4.75 (GAUZE/BANDAGES/DRESSINGS) ×2 IMPLANT
DRSG TEGADERM 8X12 (GAUZE/BANDAGES/DRESSINGS) ×2 IMPLANT
GAUZE SPONGE 4X4 12PLY STRL (GAUZE/BANDAGES/DRESSINGS) ×2 IMPLANT
GAUZE SPONGE 4X4 8PLY NS (GAUZE/BANDAGES/DRESSINGS) ×1 IMPLANT
GLOVE BIO SURGEON STRL SZ7.5 (GLOVE) ×2 IMPLANT
GLOVE SURG ORTHO 8.5 STRL (GLOVE) ×2 IMPLANT
IMPL SPACEOAR VUE SYSTEM (Spacer) ×1 IMPLANT
IMPLANT SPACEOAR VUE SYSTEM (Spacer) ×2 IMPLANT
KIT TURNOVER CYSTO (KITS) ×2 IMPLANT
MARKER GOLD PRELOAD 1.2X3 (Urological Implant) ×1 IMPLANT
MARKER SKIN DUAL TIP RULER LAB (MISCELLANEOUS) ×2 IMPLANT
NDL SPNL 22GX3.5 QUINCKE BK (NEEDLE) ×1 IMPLANT
NEEDLE SPNL 22GX3.5 QUINCKE BK (NEEDLE) ×2 IMPLANT
SEED GOLD PRELOAD 1.2X3 (Urological Implant) ×2 IMPLANT
SHEATH ULTRASOUND LF (SHEATH) IMPLANT
SHEATH ULTRASOUND LTX NONSTRL (SHEATH) ×1 IMPLANT
SURGILUBE 2OZ TUBE FLIPTOP (MISCELLANEOUS) ×2 IMPLANT
SYR 10ML LL (SYRINGE) ×2 IMPLANT
SYR CONTROL 10ML LL (SYRINGE) ×2 IMPLANT
TOWEL OR 17X26 10 PK STRL BLUE (TOWEL DISPOSABLE) ×2 IMPLANT
UNDERPAD 30X36 HEAVY ABSORB (UNDERPADS AND DIAPERS) ×2 IMPLANT

## 2021-07-20 NOTE — H&P (Signed)
cc: LUTs  ? ?12/01/20: 56 year old man with history of Parkinson's diagnosed last year comes in with 1 year history of worsening urinary frequency, nocturia, weak stream, urgency and leakage. He drinks mostly coffee and caffeine free tea during the day. He has nocturia 2-3 times a night. He denies a history of kidney stones, UTIs or hematuria. He has no family history of prostate cancer. He has not tried any medication for his symptoms.  ? ?01/22/21: 56 year old man with an elevated PSA of 4.48 and palpable prostate nodule and left mid underwent prostate biopsy and is here for results.  ? ?cT2a  ?Gleason 3+3=6 in 4/12 cores (left mid, let apex, right mid, right apex)  ?prebiopsy PA 4.48  ? ?MSK Nomogram  ?15 yr CSS: 99%  ?PFP 5/10 yrs: 92, 86%  ? ?  ?ALLERGIES: Amoxicillin ?  ?  Notes: "a  ? ?MEDICATIONS: Zyrtec  ?Atorvastatin Calcium 80 mg tablet  ?Fluticasone Propionate  ?Melatonin  ?Meloxicam  ?Multivitamin  ?Zolpidem Tartrate  ?  ? ?GU PSH: Prostate Needle Biopsy - 01/16/2021 ? ?  ? ?NON-GU PSH: Appendectomy (laparoscopic) - 1984 ?Surgical Pathology, Gross And Microscopic Examination For Prostate Needle - 01/16/2021 ? ?  ? ?GU PMH: Elevated PSA - 01/16/2021, - 01/11/2021, - 11/10/2020 ?Prostate nodule w/o LUTS - 01/16/2021, - 01/11/2021, - 11/10/2020 ?  ? ?NON-GU PMH: None  ? ?FAMILY HISTORY: 2 sons - Son ?Hypercholesterolemia - Mother, Brother ?Kidney Failure - Father  ? ?SOCIAL HISTORY: Marital Status: Divorced ?Preferred Language: [place]; Ethnicity: Not Hispanic Or Latino; Race: Black or African American ?Current Smoking Status: Patient has never smoked.  ? ?Tobacco Use Assessment Completed: Used Tobacco in last 30 days? ?Drinks 1 drink per day.  ?Drinks 2 caffeinated drinks per day. ?  ? ?REVIEW OF SYSTEMS:    ?GU Review Male:   Patient denies frequent urination, hard to postpone urination, burning/ pain with urination, get up at night to urinate, leakage of urine, stream starts and stops, trouble starting your  stream, have to strain to urinate , erection problems, and penile pain.  ?Gastrointestinal (Upper):   Patient denies nausea, vomiting, and indigestion/ heartburn.  ?Gastrointestinal (Lower):   Patient denies diarrhea and constipation.  ?Constitutional:   Patient denies fever, night sweats, weight loss, and fatigue.  ?Skin:   Patient denies skin rash/ lesion and itching.  ?Eyes:   Patient denies blurred vision and double vision.  ?Ears/ Nose/ Throat:   Patient denies sore throat and sinus problems.  ?Hematologic/Lymphatic:   Patient denies swollen glands and easy bruising.  ?Cardiovascular:   Patient denies leg swelling and chest pains.  ?Respiratory:   Patient denies cough and shortness of breath.  ?Endocrine:   Patient denies excessive thirst.  ?Musculoskeletal:   Patient denies back pain and joint pain.  ?Neurological:   Patient denies headaches and dizziness.  ?Psychologic:   Patient denies depression and anxiety.  ? ?VITAL SIGNS: None  ? ?MULTI-SYSTEM PHYSICAL EXAMINATION:    ?Constitutional: Well-nourished. No physical deformities. Normally developed. Good grooming.  ?Neck: Neck symmetrical, not swollen. Normal tracheal position.  ?Respiratory: No labored breathing, no use of accessory muscles.   ?Skin: No paleness, no jaundice, no cyanosis. No lesion, no ulcer, no rash.  ?Neurologic / Psychiatric: Oriented to time, oriented to place, oriented to person. No depression, no anxiety, no agitation.  ?Gastrointestinal: No rigidity, non obese abdomen.   ?Eyes: Normal conjunctivae. Normal eyelids.  ?Ears, Nose, Mouth, and Throat: Left ear no scars, no lesions, no masses. Right ear  no scars, no lesions, no masses. Nose no scars, no lesions, no masses. Normal hearing. Normal lips.  ?Musculoskeletal: Normal gait and station of head and neck.  ? ?  ?Complexity of Data:  ?Records Review:   Previous Patient Records, POC Tool  ?Urine Test Review:   Urinalysis  ? 11/10/20  ?PSA  ?Total PSA 3.81 ng/mL  ? ? ?PROCEDURES: None   ? ?ASSESSMENT:  ?    ICD-10 Details  ?1 GU:   Prostate Cancer - C61 Undiagnosed New Problem  ? ?PLAN:    ? ?      Orders ? ? ?      Schedule ?X-Rays: 8 Weeks - MRI Prostate GSORAD With and Without I.V. Contrast  ? ? ?      Document ?Letter(s):  Created for Patient: Clinical Summary  ? ? ?     Notes:   1. Prostate cancer: Reviewed and provided copy of biopsy pathology report  ?Counseled regarding diagnosis, gleason grade system, clinical staging system and patient's own diagnosis grade and stage. Counseled regarding prostate cancer treatment including active surveillance, radiation, and surgery.  ? ?Counseled patient regarding options for curative treatment of robotic prostatectomy.  ?counseled regarding hospitalization and recovery including indwelling catheter for 7 days and post operative restrictions such as lifting restriction less than 10-15 pounds for 4-6 weeks  ?counseled regarding risks including but not limited to operative risks such as heart attack and stroke and post treatment side effects such as incomplete cure of cancer, need for adjuvant therapy, urinary incontinence and erectile dysfunction requiring treatment or intervention  ?Such interventions for incontinence and ED discussed.  ? ?provided AUA foundation literature regarding prostate cancer and Discussed the MSK pretreatment nomograms. Patient with [99%] 15 yr CSS and [92%, 86%] 5, 10 yr progression free survival after prostatectomy. Also discussed need for lifelong surveillance and options for secondary treatment should that be necessary.  ?[email or web address]  ? ?patient expressed understanding regarding all above  ?answered all current questions  ? ?patient interested is pursuing radiation and will be referred to Dr. Tammi Klippel  ?  ? ?

## 2021-07-20 NOTE — Transfer of Care (Signed)
Immediate Anesthesia Transfer of Care Note ? ?Patient: Michael Berg ? ?Procedure(s) Performed: GOLD SEED IMPLANT (Prostate) ?SPACE OAR INSTILLATION (Prostate) ? ?Patient Location: PACU ? ?Anesthesia Type:MAC ? ?Level of Consciousness: awake, alert , oriented and patient cooperative ? ?Airway & Oxygen Therapy: Patient Spontanous Breathing ? ?Post-op Assessment: Report given to RN and Post -op Vital signs reviewed and stable ? ?Post vital signs: Reviewed and stable ? ?Last Vitals:  ?Vitals Value Taken Time  ?BP    ?Temp    ?Pulse 78 07/20/21 1022  ?Resp 14 07/20/21 1022  ?SpO2 100 % 07/20/21 1022  ?Vitals shown include unvalidated device data. ? ?Last Pain:  ?Vitals:  ? 07/20/21 0848  ?TempSrc: Oral  ?PainSc: 0-No pain  ?   ? ?Patients Stated Pain Goal: 4 (07/20/21 0848) ? ?Complications: No notable events documented. ?

## 2021-07-20 NOTE — Anesthesia Postprocedure Evaluation (Signed)
Anesthesia Post Note ? ?Patient: Michael Berg ? ?Procedure(s) Performed: GOLD SEED IMPLANT (Prostate) ?SPACE OAR INSTILLATION (Prostate) ? ?  ? ?Patient location during evaluation: PACU ?Anesthesia Type: MAC ?Level of consciousness: awake ?Pain management: pain level controlled ?Vital Signs Assessment: post-procedure vital signs reviewed and stable ?Respiratory status: spontaneous breathing, nonlabored ventilation, respiratory function stable and patient connected to nasal cannula oxygen ?Cardiovascular status: stable and blood pressure returned to baseline ?Postop Assessment: no apparent nausea or vomiting ?Anesthetic complications: no ? ? ?No notable events documented. ? ?Last Vitals:  ?Vitals:  ? 07/20/21 1101 07/20/21 1130  ?BP:  (!) 147/89  ?Pulse: 64 86  ?Resp: (!) 22 18  ?Temp: 36.4 ?C 36.5 ?C  ?SpO2: 100% 99%  ?  ?Last Pain:  ?Vitals:  ? 07/20/21 1130  ?TempSrc:   ?PainSc: 0-No pain  ? ? ?  ?  ?  ?  ?  ?  ? ?Michael Berg P Michael Berg ? ? ? ? ?

## 2021-07-20 NOTE — Anesthesia Preprocedure Evaluation (Addendum)
Anesthesia Evaluation  ?Patient identified by MRN, date of birth, ID band ?Patient awake ? ? ? ?Reviewed: ?Allergy & Precautions, NPO status , Patient's Chart, lab work & pertinent test results ? ?Airway ?Mallampati: II ? ?TM Distance: >3 FB ?Neck ROM: Full ? ? ? Dental ?no notable dental hx. ? ?  ?Pulmonary ?former smoker,  ?  ?Pulmonary exam normal ? ? ? ? ? ? ? Cardiovascular ?negative cardio ROS ?Normal cardiovascular exam ? ? ?  ?Neuro/Psych ?PSYCHIATRIC DISORDERS Anxiety Depression negative neurological ROS ?   ? GI/Hepatic ?negative GI ROS, Neg liver ROS,   ?Endo/Other  ?negative endocrine ROS ? Renal/GU ?negative Renal ROS  ? ?  ?Musculoskeletal ?negative musculoskeletal ROS ?(+)  ? Abdominal ?  ?Peds ? Hematology ?negative hematology ROS ?(+)   ?Anesthesia Other Findings ?PROSTATE CANCER ? Reproductive/Obstetrics ? ?  ? ? ? ? ? ? ? ? ? ? ? ? ? ?  ?  ? ? ? ? ? ? ? ?Anesthesia Physical ?Anesthesia Plan ? ?ASA: 2 ? ?Anesthesia Plan: MAC  ? ?Post-op Pain Management:   ? ?Induction: Intravenous ? ?PONV Risk Score and Plan: 1 and Ondansetron, Dexamethasone, Propofol infusion and Treatment may vary due to age or medical condition ? ?Airway Management Planned: Simple Face Mask ? ?Additional Equipment:  ? ?Intra-op Plan:  ? ?Post-operative Plan:  ? ?Informed Consent: I have reviewed the patients History and Physical, chart, labs and discussed the procedure including the risks, benefits and alternatives for the proposed anesthesia with the patient or authorized representative who has indicated his/her understanding and acceptance.  ? ? ? ?Dental advisory given ? ?Plan Discussed with: CRNA ? ?Anesthesia Plan Comments:   ? ? ? ? ? ?Anesthesia Quick Evaluation ? ?

## 2021-07-20 NOTE — Interval H&P Note (Signed)
History and Physical Interval Note: ? ?07/20/2021 ?9:24 AM ? ?Michael Berg  has presented today for surgery, with the diagnosis of PROSTATE CANCER.  The various methods of treatment have been discussed with the patient and family. After consideration of risks, benefits and other options for treatment, the patient has consented to  Procedure(s) with comments: ?GOLD SEED IMPLANT (N/A) - 30 MINS ?SPACE OAR INSTILLATION (N/A) as a surgical intervention.  The patient's history has been reviewed, patient examined, no change in status, stable for surgery.  I have reviewed the patient's chart and labs.  Questions were answered to the patient's satisfaction.   ? ? ?Ardis Hughs ? ? ?

## 2021-07-20 NOTE — Op Note (Signed)
Preoperative diagnosis: Clinically localized adenocarcinoma of the prostate  ? ?Postoperative diagnosis: Clinically localized adenocarcinoma of the prostate ? ?Procedure: 1) Placement of fiducial markers into prostate ?                   2) Insertion of SpaceOAR hydrogel  ? ?Surgeon: Louis Meckel, M.D. ? ?Anesthesia: General ? ?EBL: Minimal ? ?Complications: None ? ?Indication: Michael Berg is a 56 y.o. gentleman with clinically localized prostate cancer. After discussing management options for treatment, he elected to proceed with radiotherapy. He presents today for the above procedures. The potential risks, complications, alternative options, and expected recovery course have been discussed in detail with the patient and he has provided informed consent to proceed. ? ?Description of procedure: The patient was administered preoperative antibiotics, placed in the dorsal lithotomy position, and prepped and draped in the usual sterile fashion. Next, transrectal ultrasonography was utilized to visualize the prostate. Three gold fiducial markers were then placed into the prostate via transperineal needles under ultrasound guidance at the right apex, right base, and left mid gland under direct ultrasound guidance. A site in the midline was then selected on the perineum for placement of an 18 g needle with saline. The needle was advanced above the rectum and below Denonvillier's fascia to the mid gland and confirmed to be in the midline on transverse imaging. One cc of saline was injected confirming appropriate expansion of this space. A total of 5 cc of saline was then injected to open the space further bilaterally. The saline syringe was then removed and the SpaceOAR hydrogel was injected with good distribution bilaterally. He tolerated the procedure well and without complications. He was given a voiding trial prior to discharge from the PACU.  ?

## 2021-07-20 NOTE — Discharge Instructions (Addendum)
For several days the patient: ? ?should increase his fluid intake and limit strenuous activity. ?he might have mild discomfort at the base of his penis or in his rectum. ?he might have blood in his urine or blood in his bowel movements. ? ?For 2-3 months he might have blood in his ejaculate (semen). ? ?Call the office immedicately: ? for blood clots in the urine or bowel movements,  ?difficulty urinating,  ?inability to urinate,  ?urinary retention,  ?painful or frequent urination,  ?fever, chills,  ?nausea, vomiting, ?other illness.  ? ? ?[facility]:  [phone]  ?Post Anesthesia Home Care Instructions ? ?Activity: ?Get plenty of rest for the remainder of the day. A responsible adult should stay with you for 24 hours following the procedure.  ?For the next 24 hours, DO NOT: ?-Drive a car ?-Paediatric nurse ?-Drink alcoholic beverages ?-Take any medication unless instructed by your physician ?-Make any legal decisions or sign important papers. ? ?Meals: ?Start with liquid foods such as gelatin or soup. Progress to regular foods as tolerated. Avoid greasy, spicy, heavy foods. If nausea and/or vomiting occur, drink only clear liquids until the nausea and/or vomiting subsides. Call your physician if vomiting continues. ? ?Special Instructions/Symptoms: ?Your throat may feel dry or sore from the anesthesia or the breathing tube placed in your throat during surgery. If this causes discomfort, gargle with warm salt water. The discomfort should disappear within 24 hours. ? ?3. Avoid touching the patch. Wash your hands with soap and water after contact with the patch. ?   ?

## 2021-07-23 ENCOUNTER — Telehealth: Payer: Self-pay | Admitting: *Deleted

## 2021-07-23 ENCOUNTER — Encounter (HOSPITAL_BASED_OUTPATIENT_CLINIC_OR_DEPARTMENT_OTHER): Payer: Self-pay | Admitting: Urology

## 2021-07-23 NOTE — Telephone Encounter (Signed)
RETURNED PATIENT'S PHONE CALL, SPOKE WITH PATIENT. ?

## 2021-07-24 ENCOUNTER — Ambulatory Visit: Payer: BC Managed Care – PPO | Admitting: Radiation Oncology

## 2021-07-25 ENCOUNTER — Telehealth: Payer: Self-pay | Admitting: *Deleted

## 2021-07-25 NOTE — Telephone Encounter (Signed)
CALLED PATIENT TO REMIND OF SIM APPT. FOR 07-26-21- ARRIVAL TIME- 8:45 AM @ [facility], INFORMED PATIENT TO ARRIVE WITH A FULL BLADDER AND AN EMPTY BOWEL, SPOKE WITH PATIENT AND HE IS AWARE OF THIS APPT. AND THE INSTRUCTIONS ?

## 2021-07-26 ENCOUNTER — Ambulatory Visit
Admission: RE | Admit: 2021-07-26 | Discharge: 2021-07-26 | Disposition: A | Discharge status: Home / Self Care | Payer: BC Managed Care – PPO | Source: Ambulatory Visit | Attending: Radiation Oncology | Admitting: Radiation Oncology

## 2021-07-26 ENCOUNTER — Other Ambulatory Visit: Payer: Self-pay

## 2021-07-26 DIAGNOSIS — Z191 Hormone sensitive malignancy status: Secondary | ICD-10-CM | POA: Diagnosis not present

## 2021-07-26 DIAGNOSIS — Z510 Encounter for antineoplastic radiation therapy: Secondary | ICD-10-CM | POA: Diagnosis not present

## 2021-07-26 DIAGNOSIS — C61 Malignant neoplasm of prostate: Secondary | ICD-10-CM | POA: Diagnosis not present

## 2021-07-26 NOTE — Progress Notes (Signed)
?  Radiation Oncology         [phone]) [phone] ?________________________________ ? ?Name: Michael Berg MRN: [id number]  ?Date: 07/26/2021  DOB: 1965-11-19 ? ?SIMULATION AND TREATMENT PLANNING NOTE ? ?  ICD-10-CM   ?1. Malignant neoplasm of prostate ([place])  C61   ?  ? ? ?DIAGNOSIS:  56 y.o. gentleman with Stage T2a adenocarcinoma of the prostate with Gleason score of 3+3, and PSA of 3.81 ? ?NARRATIVE:  The patient was brought to the [facility].  Identity was confirmed.  All relevant records and images related to the planned course of therapy were reviewed.  The patient freely provided informed written consent to proceed with treatment after reviewing the details related to the planned course of therapy. The consent form was witnessed and verified by the simulation staff.  Then, the patient was set-up in a stable reproducible supine position for radiation therapy.  A vacuum lock pillow device was custom fabricated to position his legs in a reproducible immobilized position.  Then, I performed a urethrogram under sterile conditions to identify the prostatic apex.  CT images were obtained.  Surface markings were placed.  The CT images were loaded into the planning software.  Then the prostate target and avoidance structures including the rectum, bladder, bowel and hips were contoured.  Treatment planning then occurred.  The radiation prescription was entered and confirmed.  A total of one complex treatment devices was fabricated. I have requested : Intensity Modulated Radiotherapy (IMRT) is medically necessary for this case for the following reason:  Rectal sparing.. ? ?PLAN:  The patient will receive 70 Gy in 28 fractions. ? ?________________________________ ? ?Sheral Apley Tammi Klippel, M.D. ? ?

## 2021-08-02 DIAGNOSIS — Z191 Hormone sensitive malignancy status: Secondary | ICD-10-CM | POA: Diagnosis not present

## 2021-08-02 DIAGNOSIS — Z510 Encounter for antineoplastic radiation therapy: Secondary | ICD-10-CM | POA: Diagnosis not present

## 2021-08-02 DIAGNOSIS — C61 Malignant neoplasm of prostate: Secondary | ICD-10-CM | POA: Diagnosis not present

## 2021-08-07 ENCOUNTER — Other Ambulatory Visit: Payer: Self-pay

## 2021-08-07 ENCOUNTER — Ambulatory Visit
Admission: RE | Admit: 2021-08-07 | Discharge: 2021-08-07 | Disposition: A | Discharge status: Home / Self Care | Payer: BC Managed Care – PPO | Source: Ambulatory Visit | Attending: Radiation Oncology | Admitting: Radiation Oncology

## 2021-08-07 DIAGNOSIS — Z510 Encounter for antineoplastic radiation therapy: Secondary | ICD-10-CM | POA: Diagnosis not present

## 2021-08-07 DIAGNOSIS — C61 Malignant neoplasm of prostate: Secondary | ICD-10-CM | POA: Diagnosis not present

## 2021-08-07 DIAGNOSIS — Z191 Hormone sensitive malignancy status: Secondary | ICD-10-CM | POA: Diagnosis not present

## 2021-08-07 LAB — RAD ONC ARIA SESSION SUMMARY
Course Elapsed Days: 0
Plan Fractions Treated to Date: 1
Plan Prescribed Dose Per Fraction: 2.5 Gy
Plan Total Fractions Prescribed: 28
Plan Total Prescribed Dose: 70 Gy
Reference Point Dosage Given to Date: 2.5 Gy
Reference Point Session Dosage Given: 2.5 Gy
Session Number: 1

## 2021-08-08 ENCOUNTER — Ambulatory Visit
Admission: RE | Admit: 2021-08-08 | Discharge: 2021-08-08 | Disposition: A | Discharge status: Home / Self Care | Payer: BC Managed Care – PPO | Source: Ambulatory Visit | Attending: Radiation Oncology | Admitting: Radiation Oncology

## 2021-08-08 ENCOUNTER — Other Ambulatory Visit: Payer: Self-pay

## 2021-08-08 DIAGNOSIS — C61 Malignant neoplasm of prostate: Secondary | ICD-10-CM | POA: Diagnosis not present

## 2021-08-08 DIAGNOSIS — Z191 Hormone sensitive malignancy status: Secondary | ICD-10-CM | POA: Diagnosis not present

## 2021-08-08 DIAGNOSIS — Z510 Encounter for antineoplastic radiation therapy: Secondary | ICD-10-CM | POA: Diagnosis not present

## 2021-08-08 LAB — RAD ONC ARIA SESSION SUMMARY
Course Elapsed Days: 1
Plan Fractions Treated to Date: 2
Plan Prescribed Dose Per Fraction: 2.5 Gy
Plan Total Fractions Prescribed: 28
Plan Total Prescribed Dose: 70 Gy
Reference Point Dosage Given to Date: 5 Gy
Reference Point Session Dosage Given: 2.5 Gy
Session Number: 2

## 2021-08-09 ENCOUNTER — Ambulatory Visit
Admission: RE | Admit: 2021-08-09 | Discharge: 2021-08-09 | Disposition: A | Discharge status: Home / Self Care | Payer: BC Managed Care – PPO | Source: Ambulatory Visit | Attending: Radiation Oncology | Admitting: Radiation Oncology

## 2021-08-09 ENCOUNTER — Other Ambulatory Visit: Payer: Self-pay

## 2021-08-09 DIAGNOSIS — C61 Malignant neoplasm of prostate: Secondary | ICD-10-CM | POA: Diagnosis not present

## 2021-08-09 DIAGNOSIS — Z191 Hormone sensitive malignancy status: Secondary | ICD-10-CM | POA: Diagnosis not present

## 2021-08-09 DIAGNOSIS — Z510 Encounter for antineoplastic radiation therapy: Secondary | ICD-10-CM | POA: Diagnosis not present

## 2021-08-09 LAB — RAD ONC ARIA SESSION SUMMARY
Course Elapsed Days: 2
Plan Fractions Treated to Date: 3
Plan Prescribed Dose Per Fraction: 2.5 Gy
Plan Total Fractions Prescribed: 28
Plan Total Prescribed Dose: 70 Gy
Reference Point Dosage Given to Date: 7.5 Gy
Reference Point Session Dosage Given: 2.5 Gy
Session Number: 3

## 2021-08-10 ENCOUNTER — Other Ambulatory Visit: Payer: Self-pay

## 2021-08-10 ENCOUNTER — Ambulatory Visit
Admission: RE | Admit: 2021-08-10 | Discharge: 2021-08-10 | Disposition: A | Discharge status: Home / Self Care | Payer: BC Managed Care – PPO | Source: Ambulatory Visit | Attending: Radiation Oncology | Admitting: Radiation Oncology

## 2021-08-10 DIAGNOSIS — C61 Malignant neoplasm of prostate: Secondary | ICD-10-CM | POA: Diagnosis not present

## 2021-08-10 DIAGNOSIS — Z191 Hormone sensitive malignancy status: Secondary | ICD-10-CM | POA: Diagnosis not present

## 2021-08-10 DIAGNOSIS — Z510 Encounter for antineoplastic radiation therapy: Secondary | ICD-10-CM | POA: Diagnosis not present

## 2021-08-10 LAB — RAD ONC ARIA SESSION SUMMARY
Course Elapsed Days: 3
Plan Fractions Treated to Date: 4
Plan Prescribed Dose Per Fraction: 2.5 Gy
Plan Total Fractions Prescribed: 28
Plan Total Prescribed Dose: 70 Gy
Reference Point Dosage Given to Date: 10 Gy
Reference Point Session Dosage Given: 2.5 Gy
Session Number: 4

## 2021-08-13 ENCOUNTER — Ambulatory Visit
Admission: RE | Admit: 2021-08-13 | Discharge: 2021-08-13 | Disposition: A | Discharge status: Home / Self Care | Payer: BC Managed Care – PPO | Source: Ambulatory Visit | Attending: Radiation Oncology | Admitting: Radiation Oncology

## 2021-08-13 ENCOUNTER — Other Ambulatory Visit: Payer: Self-pay

## 2021-08-13 DIAGNOSIS — Z191 Hormone sensitive malignancy status: Secondary | ICD-10-CM | POA: Diagnosis not present

## 2021-08-13 DIAGNOSIS — Z510 Encounter for antineoplastic radiation therapy: Secondary | ICD-10-CM | POA: Diagnosis not present

## 2021-08-13 DIAGNOSIS — C61 Malignant neoplasm of prostate: Secondary | ICD-10-CM | POA: Diagnosis not present

## 2021-08-13 LAB — RAD ONC ARIA SESSION SUMMARY
Course Elapsed Days: 6
Plan Fractions Treated to Date: 5
Plan Prescribed Dose Per Fraction: 2.5 Gy
Plan Total Fractions Prescribed: 28
Plan Total Prescribed Dose: 70 Gy
Reference Point Dosage Given to Date: 12.5 Gy
Reference Point Session Dosage Given: 2.5 Gy
Session Number: 5

## 2021-08-14 ENCOUNTER — Other Ambulatory Visit: Payer: Self-pay

## 2021-08-14 ENCOUNTER — Ambulatory Visit
Admission: RE | Admit: 2021-08-14 | Discharge: 2021-08-14 | Disposition: A | Discharge status: Home / Self Care | Payer: BC Managed Care – PPO | Source: Ambulatory Visit | Attending: Radiation Oncology | Admitting: Radiation Oncology

## 2021-08-14 DIAGNOSIS — Z510 Encounter for antineoplastic radiation therapy: Secondary | ICD-10-CM | POA: Diagnosis not present

## 2021-08-14 DIAGNOSIS — C61 Malignant neoplasm of prostate: Secondary | ICD-10-CM | POA: Diagnosis not present

## 2021-08-14 DIAGNOSIS — Z191 Hormone sensitive malignancy status: Secondary | ICD-10-CM | POA: Diagnosis not present

## 2021-08-14 LAB — RAD ONC ARIA SESSION SUMMARY
Course Elapsed Days: 7
Plan Fractions Treated to Date: 6
Plan Prescribed Dose Per Fraction: 2.5 Gy
Plan Total Fractions Prescribed: 28
Plan Total Prescribed Dose: 70 Gy
Reference Point Dosage Given to Date: 15 Gy
Reference Point Session Dosage Given: 2.5 Gy
Session Number: 6

## 2021-08-15 ENCOUNTER — Other Ambulatory Visit: Payer: Self-pay

## 2021-08-15 ENCOUNTER — Ambulatory Visit
Admission: RE | Admit: 2021-08-15 | Discharge: 2021-08-15 | Disposition: A | Discharge status: Home / Self Care | Payer: BC Managed Care – PPO | Source: Ambulatory Visit | Attending: Radiation Oncology | Admitting: Radiation Oncology

## 2021-08-15 DIAGNOSIS — C61 Malignant neoplasm of prostate: Secondary | ICD-10-CM | POA: Diagnosis not present

## 2021-08-15 DIAGNOSIS — Z510 Encounter for antineoplastic radiation therapy: Secondary | ICD-10-CM | POA: Diagnosis not present

## 2021-08-15 DIAGNOSIS — Z191 Hormone sensitive malignancy status: Secondary | ICD-10-CM | POA: Diagnosis not present

## 2021-08-15 LAB — RAD ONC ARIA SESSION SUMMARY
Course Elapsed Days: 8
Plan Fractions Treated to Date: 7
Plan Prescribed Dose Per Fraction: 2.5 Gy
Plan Total Fractions Prescribed: 28
Plan Total Prescribed Dose: 70 Gy
Reference Point Dosage Given to Date: 17.5 Gy
Reference Point Session Dosage Given: 2.5 Gy
Session Number: 7

## 2021-08-16 ENCOUNTER — Other Ambulatory Visit: Payer: Self-pay

## 2021-08-16 ENCOUNTER — Ambulatory Visit
Admission: RE | Admit: 2021-08-16 | Discharge: 2021-08-16 | Disposition: A | Discharge status: Home / Self Care | Payer: BC Managed Care – PPO | Source: Ambulatory Visit | Attending: Radiation Oncology | Admitting: Radiation Oncology

## 2021-08-16 DIAGNOSIS — Z510 Encounter for antineoplastic radiation therapy: Secondary | ICD-10-CM | POA: Diagnosis not present

## 2021-08-16 DIAGNOSIS — C61 Malignant neoplasm of prostate: Secondary | ICD-10-CM | POA: Diagnosis not present

## 2021-08-16 DIAGNOSIS — Z191 Hormone sensitive malignancy status: Secondary | ICD-10-CM | POA: Diagnosis not present

## 2021-08-16 LAB — RAD ONC ARIA SESSION SUMMARY
Course Elapsed Days: 9
Plan Fractions Treated to Date: 8
Plan Prescribed Dose Per Fraction: 2.5 Gy
Plan Total Fractions Prescribed: 28
Plan Total Prescribed Dose: 70 Gy
Reference Point Dosage Given to Date: 20 Gy
Reference Point Session Dosage Given: 2.5 Gy
Session Number: 8

## 2021-08-17 ENCOUNTER — Other Ambulatory Visit: Payer: Self-pay | Admitting: Radiation Oncology

## 2021-08-17 ENCOUNTER — Other Ambulatory Visit: Payer: Self-pay

## 2021-08-17 ENCOUNTER — Ambulatory Visit
Admission: RE | Admit: 2021-08-17 | Discharge: 2021-08-17 | Disposition: A | Discharge status: Home / Self Care | Payer: BC Managed Care – PPO | Source: Ambulatory Visit | Attending: Radiation Oncology | Admitting: Radiation Oncology

## 2021-08-17 DIAGNOSIS — C61 Malignant neoplasm of prostate: Secondary | ICD-10-CM | POA: Diagnosis not present

## 2021-08-17 DIAGNOSIS — Z510 Encounter for antineoplastic radiation therapy: Secondary | ICD-10-CM | POA: Diagnosis not present

## 2021-08-17 DIAGNOSIS — Z191 Hormone sensitive malignancy status: Secondary | ICD-10-CM | POA: Diagnosis not present

## 2021-08-17 LAB — RAD ONC ARIA SESSION SUMMARY
Course Elapsed Days: 10
Plan Fractions Treated to Date: 9
Plan Prescribed Dose Per Fraction: 2.5 Gy
Plan Total Fractions Prescribed: 28
Plan Total Prescribed Dose: 70 Gy
Reference Point Dosage Given to Date: 22.5 Gy
Reference Point Session Dosage Given: 2.5 Gy
Session Number: 9

## 2021-08-17 MED ORDER — TAMSULOSIN HCL 0.4 MG PO CAPS: 0.4000 mg | ORAL_CAPSULE | Freq: Every day | ORAL | 5 refills | Status: DC

## 2021-08-21 ENCOUNTER — Other Ambulatory Visit: Payer: Self-pay

## 2021-08-21 ENCOUNTER — Ambulatory Visit
Admission: RE | Admit: 2021-08-21 | Discharge: 2021-08-21 | Disposition: A | Discharge status: Home / Self Care | Payer: BC Managed Care – PPO | Source: Ambulatory Visit | Attending: Radiation Oncology | Admitting: Radiation Oncology

## 2021-08-21 DIAGNOSIS — C61 Malignant neoplasm of prostate: Secondary | ICD-10-CM | POA: Diagnosis not present

## 2021-08-21 DIAGNOSIS — Z191 Hormone sensitive malignancy status: Secondary | ICD-10-CM | POA: Diagnosis not present

## 2021-08-21 DIAGNOSIS — Z510 Encounter for antineoplastic radiation therapy: Secondary | ICD-10-CM | POA: Diagnosis not present

## 2021-08-21 LAB — RAD ONC ARIA SESSION SUMMARY
Course Elapsed Days: 14
Plan Fractions Treated to Date: 10
Plan Prescribed Dose Per Fraction: 2.5 Gy
Plan Total Fractions Prescribed: 28
Plan Total Prescribed Dose: 70 Gy
Reference Point Dosage Given to Date: 25 Gy
Reference Point Session Dosage Given: 2.5 Gy
Session Number: 10

## 2021-08-22 ENCOUNTER — Ambulatory Visit
Admission: RE | Admit: 2021-08-22 | Discharge: 2021-08-22 | Disposition: A | Discharge status: Home / Self Care | Payer: BC Managed Care – PPO | Source: Ambulatory Visit | Attending: Radiation Oncology | Admitting: Radiation Oncology

## 2021-08-22 ENCOUNTER — Other Ambulatory Visit: Payer: Self-pay

## 2021-08-22 DIAGNOSIS — Z191 Hormone sensitive malignancy status: Secondary | ICD-10-CM | POA: Diagnosis not present

## 2021-08-22 DIAGNOSIS — C61 Malignant neoplasm of prostate: Secondary | ICD-10-CM | POA: Diagnosis not present

## 2021-08-22 DIAGNOSIS — Z510 Encounter for antineoplastic radiation therapy: Secondary | ICD-10-CM | POA: Diagnosis not present

## 2021-08-22 LAB — RAD ONC ARIA SESSION SUMMARY
Course Elapsed Days: 15
Plan Fractions Treated to Date: 11
Plan Prescribed Dose Per Fraction: 2.5 Gy
Plan Total Fractions Prescribed: 28
Plan Total Prescribed Dose: 70 Gy
Reference Point Dosage Given to Date: 27.5 Gy
Reference Point Session Dosage Given: 2.5 Gy
Session Number: 11

## 2021-08-23 ENCOUNTER — Other Ambulatory Visit: Payer: Self-pay

## 2021-08-23 ENCOUNTER — Ambulatory Visit
Admission: RE | Admit: 2021-08-23 | Discharge: 2021-08-23 | Disposition: A | Discharge status: Home / Self Care | Payer: BC Managed Care – PPO | Source: Ambulatory Visit | Attending: Radiation Oncology | Admitting: Radiation Oncology

## 2021-08-23 DIAGNOSIS — Z510 Encounter for antineoplastic radiation therapy: Secondary | ICD-10-CM | POA: Diagnosis not present

## 2021-08-23 DIAGNOSIS — C61 Malignant neoplasm of prostate: Secondary | ICD-10-CM | POA: Diagnosis not present

## 2021-08-23 DIAGNOSIS — Z191 Hormone sensitive malignancy status: Secondary | ICD-10-CM | POA: Diagnosis not present

## 2021-08-23 LAB — RAD ONC ARIA SESSION SUMMARY
Course Elapsed Days: 16
Plan Fractions Treated to Date: 12
Plan Prescribed Dose Per Fraction: 2.5 Gy
Plan Total Fractions Prescribed: 28
Plan Total Prescribed Dose: 70 Gy
Reference Point Dosage Given to Date: 30 Gy
Reference Point Session Dosage Given: 2.5 Gy
Session Number: 12

## 2021-08-24 ENCOUNTER — Other Ambulatory Visit: Payer: Self-pay

## 2021-08-24 ENCOUNTER — Ambulatory Visit
Admission: RE | Admit: 2021-08-24 | Discharge: 2021-08-24 | Disposition: A | Discharge status: Home / Self Care | Payer: BC Managed Care – PPO | Source: Ambulatory Visit | Attending: Radiation Oncology | Admitting: Radiation Oncology

## 2021-08-24 DIAGNOSIS — Z191 Hormone sensitive malignancy status: Secondary | ICD-10-CM | POA: Diagnosis not present

## 2021-08-24 DIAGNOSIS — Z510 Encounter for antineoplastic radiation therapy: Secondary | ICD-10-CM | POA: Diagnosis not present

## 2021-08-24 DIAGNOSIS — C61 Malignant neoplasm of prostate: Secondary | ICD-10-CM | POA: Diagnosis not present

## 2021-08-24 LAB — RAD ONC ARIA SESSION SUMMARY
Course Elapsed Days: 17
Plan Fractions Treated to Date: 13
Plan Prescribed Dose Per Fraction: 2.5 Gy
Plan Total Fractions Prescribed: 28
Plan Total Prescribed Dose: 70 Gy
Reference Point Dosage Given to Date: 32.5 Gy
Reference Point Session Dosage Given: 2.5 Gy
Session Number: 13

## 2021-08-27 ENCOUNTER — Other Ambulatory Visit: Payer: Self-pay

## 2021-08-27 ENCOUNTER — Ambulatory Visit
Admission: RE | Admit: 2021-08-27 | Discharge: 2021-08-27 | Disposition: A | Discharge status: Home / Self Care | Payer: BC Managed Care – PPO | Source: Ambulatory Visit | Attending: Radiation Oncology | Admitting: Radiation Oncology

## 2021-08-27 DIAGNOSIS — Z191 Hormone sensitive malignancy status: Secondary | ICD-10-CM | POA: Diagnosis not present

## 2021-08-27 DIAGNOSIS — Z510 Encounter for antineoplastic radiation therapy: Secondary | ICD-10-CM | POA: Diagnosis not present

## 2021-08-27 DIAGNOSIS — C61 Malignant neoplasm of prostate: Secondary | ICD-10-CM | POA: Diagnosis not present

## 2021-08-27 LAB — RAD ONC ARIA SESSION SUMMARY
Course Elapsed Days: 20
Plan Fractions Treated to Date: 14
Plan Prescribed Dose Per Fraction: 2.5 Gy
Plan Total Fractions Prescribed: 28
Plan Total Prescribed Dose: 70 Gy
Reference Point Dosage Given to Date: 35 Gy
Reference Point Session Dosage Given: 2.5 Gy
Session Number: 14

## 2021-08-28 ENCOUNTER — Ambulatory Visit
Admission: RE | Admit: 2021-08-28 | Discharge: 2021-08-28 | Disposition: A | Discharge status: Home / Self Care | Payer: BC Managed Care – PPO | Source: Ambulatory Visit | Attending: Radiation Oncology | Admitting: Radiation Oncology

## 2021-08-28 ENCOUNTER — Other Ambulatory Visit: Payer: Self-pay

## 2021-08-28 DIAGNOSIS — Z510 Encounter for antineoplastic radiation therapy: Secondary | ICD-10-CM | POA: Diagnosis not present

## 2021-08-28 DIAGNOSIS — Z191 Hormone sensitive malignancy status: Secondary | ICD-10-CM | POA: Diagnosis not present

## 2021-08-28 DIAGNOSIS — C61 Malignant neoplasm of prostate: Secondary | ICD-10-CM | POA: Diagnosis not present

## 2021-08-28 LAB — RAD ONC ARIA SESSION SUMMARY
Course Elapsed Days: 21
Plan Fractions Treated to Date: 15
Plan Prescribed Dose Per Fraction: 2.5 Gy
Plan Total Fractions Prescribed: 28
Plan Total Prescribed Dose: 70 Gy
Reference Point Dosage Given to Date: 37.5 Gy
Reference Point Session Dosage Given: 2.5 Gy
Session Number: 15

## 2021-08-29 ENCOUNTER — Other Ambulatory Visit: Payer: Self-pay

## 2021-08-29 ENCOUNTER — Ambulatory Visit
Admission: RE | Admit: 2021-08-29 | Discharge: 2021-08-29 | Disposition: A | Discharge status: Home / Self Care | Payer: BC Managed Care – PPO | Source: Ambulatory Visit | Attending: Radiation Oncology | Admitting: Radiation Oncology

## 2021-08-29 DIAGNOSIS — Z510 Encounter for antineoplastic radiation therapy: Secondary | ICD-10-CM | POA: Diagnosis not present

## 2021-08-29 DIAGNOSIS — Z191 Hormone sensitive malignancy status: Secondary | ICD-10-CM | POA: Diagnosis not present

## 2021-08-29 DIAGNOSIS — C61 Malignant neoplasm of prostate: Secondary | ICD-10-CM | POA: Diagnosis not present

## 2021-08-29 LAB — RAD ONC ARIA SESSION SUMMARY
Course Elapsed Days: 22
Plan Fractions Treated to Date: 16
Plan Prescribed Dose Per Fraction: 2.5 Gy
Plan Total Fractions Prescribed: 28
Plan Total Prescribed Dose: 70 Gy
Reference Point Dosage Given to Date: 40 Gy
Reference Point Session Dosage Given: 2.5 Gy
Session Number: 16

## 2021-08-30 ENCOUNTER — Ambulatory Visit
Admission: RE | Admit: 2021-08-30 | Discharge: 2021-08-30 | Disposition: A | Discharge status: Home / Self Care | Payer: BC Managed Care – PPO | Source: Ambulatory Visit | Attending: Radiation Oncology | Admitting: Radiation Oncology

## 2021-08-30 ENCOUNTER — Other Ambulatory Visit: Payer: Self-pay

## 2021-08-30 DIAGNOSIS — C61 Malignant neoplasm of prostate: Secondary | ICD-10-CM | POA: Diagnosis not present

## 2021-08-30 DIAGNOSIS — Z510 Encounter for antineoplastic radiation therapy: Secondary | ICD-10-CM | POA: Diagnosis not present

## 2021-08-30 DIAGNOSIS — Z191 Hormone sensitive malignancy status: Secondary | ICD-10-CM | POA: Diagnosis not present

## 2021-08-30 LAB — RAD ONC ARIA SESSION SUMMARY
Course Elapsed Days: 23
Plan Fractions Treated to Date: 17
Plan Prescribed Dose Per Fraction: 2.5 Gy
Plan Total Fractions Prescribed: 28
Plan Total Prescribed Dose: 70 Gy
Reference Point Dosage Given to Date: 42.5 Gy
Reference Point Session Dosage Given: 2.5 Gy
Session Number: 17

## 2021-08-31 ENCOUNTER — Ambulatory Visit: Payer: BC Managed Care – PPO

## 2021-08-31 ENCOUNTER — Ambulatory Visit
Admission: RE | Admit: 2021-08-31 | Discharge: 2021-08-31 | Disposition: A | Discharge status: Home / Self Care | Payer: BC Managed Care – PPO | Source: Ambulatory Visit | Attending: Radiation Oncology | Admitting: Radiation Oncology

## 2021-08-31 ENCOUNTER — Other Ambulatory Visit: Payer: Self-pay

## 2021-08-31 DIAGNOSIS — Z510 Encounter for antineoplastic radiation therapy: Secondary | ICD-10-CM | POA: Diagnosis not present

## 2021-08-31 DIAGNOSIS — Z191 Hormone sensitive malignancy status: Secondary | ICD-10-CM | POA: Diagnosis not present

## 2021-08-31 DIAGNOSIS — C61 Malignant neoplasm of prostate: Secondary | ICD-10-CM | POA: Diagnosis not present

## 2021-08-31 LAB — RAD ONC ARIA SESSION SUMMARY
Course Elapsed Days: 24
Plan Fractions Treated to Date: 18
Plan Prescribed Dose Per Fraction: 2.5 Gy
Plan Total Fractions Prescribed: 28
Plan Total Prescribed Dose: 70 Gy
Reference Point Dosage Given to Date: 45 Gy
Reference Point Session Dosage Given: 2.5 Gy
Session Number: 18

## 2021-09-03 ENCOUNTER — Ambulatory Visit
Admission: RE | Admit: 2021-09-03 | Discharge: 2021-09-03 | Disposition: A | Discharge status: Home / Self Care | Payer: BC Managed Care – PPO | Source: Ambulatory Visit | Attending: Radiation Oncology | Admitting: Radiation Oncology

## 2021-09-03 ENCOUNTER — Other Ambulatory Visit: Payer: Self-pay

## 2021-09-03 DIAGNOSIS — Z510 Encounter for antineoplastic radiation therapy: Secondary | ICD-10-CM | POA: Diagnosis not present

## 2021-09-03 DIAGNOSIS — C61 Malignant neoplasm of prostate: Secondary | ICD-10-CM | POA: Diagnosis not present

## 2021-09-03 DIAGNOSIS — Z191 Hormone sensitive malignancy status: Secondary | ICD-10-CM | POA: Diagnosis not present

## 2021-09-03 LAB — RAD ONC ARIA SESSION SUMMARY
Course Elapsed Days: 27
Plan Fractions Treated to Date: 19
Plan Prescribed Dose Per Fraction: 2.5 Gy
Plan Total Fractions Prescribed: 28
Plan Total Prescribed Dose: 70 Gy
Reference Point Dosage Given to Date: 47.5 Gy
Reference Point Session Dosage Given: 2.5 Gy
Session Number: 19

## 2021-09-04 ENCOUNTER — Other Ambulatory Visit: Payer: Self-pay

## 2021-09-04 ENCOUNTER — Ambulatory Visit
Admission: RE | Admit: 2021-09-04 | Discharge: 2021-09-04 | Disposition: A | Discharge status: Home / Self Care | Payer: BC Managed Care – PPO | Source: Ambulatory Visit | Attending: Radiation Oncology | Admitting: Radiation Oncology

## 2021-09-04 DIAGNOSIS — C61 Malignant neoplasm of prostate: Secondary | ICD-10-CM | POA: Diagnosis not present

## 2021-09-04 DIAGNOSIS — Z510 Encounter for antineoplastic radiation therapy: Secondary | ICD-10-CM | POA: Diagnosis not present

## 2021-09-04 DIAGNOSIS — Z191 Hormone sensitive malignancy status: Secondary | ICD-10-CM | POA: Diagnosis not present

## 2021-09-04 LAB — RAD ONC ARIA SESSION SUMMARY
Course Elapsed Days: 28
Plan Fractions Treated to Date: 20
Plan Prescribed Dose Per Fraction: 2.5 Gy
Plan Total Fractions Prescribed: 28
Plan Total Prescribed Dose: 70 Gy
Reference Point Dosage Given to Date: 50 Gy
Reference Point Session Dosage Given: 2.5 Gy
Session Number: 20

## 2021-09-05 ENCOUNTER — Ambulatory Visit
Admission: RE | Admit: 2021-09-05 | Discharge: 2021-09-05 | Disposition: A | Discharge status: Home / Self Care | Payer: BC Managed Care – PPO | Source: Ambulatory Visit | Attending: Radiation Oncology | Admitting: Radiation Oncology

## 2021-09-05 ENCOUNTER — Other Ambulatory Visit: Payer: Self-pay

## 2021-09-05 DIAGNOSIS — Z510 Encounter for antineoplastic radiation therapy: Secondary | ICD-10-CM | POA: Diagnosis not present

## 2021-09-05 DIAGNOSIS — C61 Malignant neoplasm of prostate: Secondary | ICD-10-CM | POA: Diagnosis not present

## 2021-09-05 DIAGNOSIS — Z191 Hormone sensitive malignancy status: Secondary | ICD-10-CM | POA: Diagnosis not present

## 2021-09-05 LAB — RAD ONC ARIA SESSION SUMMARY
Course Elapsed Days: 29
Plan Fractions Treated to Date: 21
Plan Prescribed Dose Per Fraction: 2.5 Gy
Plan Total Fractions Prescribed: 28
Plan Total Prescribed Dose: 70 Gy
Reference Point Dosage Given to Date: 52.5 Gy
Reference Point Session Dosage Given: 2.5 Gy
Session Number: 21

## 2021-09-06 ENCOUNTER — Ambulatory Visit: Payer: BC Managed Care – PPO

## 2021-09-06 ENCOUNTER — Ambulatory Visit
Admission: RE | Admit: 2021-09-06 | Discharge: 2021-09-06 | Disposition: A | Discharge status: Home / Self Care | Payer: BC Managed Care – PPO | Source: Ambulatory Visit | Attending: Radiation Oncology | Admitting: Radiation Oncology

## 2021-09-06 ENCOUNTER — Other Ambulatory Visit: Payer: Self-pay

## 2021-09-06 DIAGNOSIS — Z510 Encounter for antineoplastic radiation therapy: Secondary | ICD-10-CM | POA: Diagnosis not present

## 2021-09-06 DIAGNOSIS — Z191 Hormone sensitive malignancy status: Secondary | ICD-10-CM | POA: Diagnosis not present

## 2021-09-06 DIAGNOSIS — C61 Malignant neoplasm of prostate: Secondary | ICD-10-CM | POA: Diagnosis not present

## 2021-09-06 LAB — RAD ONC ARIA SESSION SUMMARY
Course Elapsed Days: 30
Plan Fractions Treated to Date: 22
Plan Prescribed Dose Per Fraction: 2.5 Gy
Plan Total Fractions Prescribed: 28
Plan Total Prescribed Dose: 70 Gy
Reference Point Dosage Given to Date: 55 Gy
Reference Point Session Dosage Given: 2.5 Gy
Session Number: 22

## 2021-09-07 ENCOUNTER — Other Ambulatory Visit: Payer: Self-pay

## 2021-09-07 ENCOUNTER — Ambulatory Visit
Admission: RE | Admit: 2021-09-07 | Discharge: 2021-09-07 | Disposition: A | Discharge status: Home / Self Care | Payer: BC Managed Care – PPO | Source: Ambulatory Visit | Attending: Radiation Oncology | Admitting: Radiation Oncology

## 2021-09-07 ENCOUNTER — Ambulatory Visit: Payer: BC Managed Care – PPO

## 2021-09-07 DIAGNOSIS — Z510 Encounter for antineoplastic radiation therapy: Secondary | ICD-10-CM | POA: Diagnosis not present

## 2021-09-07 DIAGNOSIS — C61 Malignant neoplasm of prostate: Secondary | ICD-10-CM | POA: Diagnosis not present

## 2021-09-07 DIAGNOSIS — Z191 Hormone sensitive malignancy status: Secondary | ICD-10-CM | POA: Diagnosis not present

## 2021-09-07 LAB — RAD ONC ARIA SESSION SUMMARY
Course Elapsed Days: 31
Plan Fractions Treated to Date: 23
Plan Prescribed Dose Per Fraction: 2.5 Gy
Plan Total Fractions Prescribed: 28
Plan Total Prescribed Dose: 70 Gy
Reference Point Dosage Given to Date: 57.5 Gy
Reference Point Session Dosage Given: 2.5 Gy
Session Number: 23

## 2021-09-10 ENCOUNTER — Ambulatory Visit
Admission: RE | Admit: 2021-09-10 | Discharge: 2021-09-10 | Disposition: A | Discharge status: Home / Self Care | Payer: BC Managed Care – PPO | Source: Ambulatory Visit | Attending: Radiation Oncology | Admitting: Radiation Oncology

## 2021-09-10 ENCOUNTER — Other Ambulatory Visit: Payer: Self-pay

## 2021-09-10 DIAGNOSIS — Z191 Hormone sensitive malignancy status: Secondary | ICD-10-CM | POA: Diagnosis not present

## 2021-09-10 DIAGNOSIS — Z510 Encounter for antineoplastic radiation therapy: Secondary | ICD-10-CM | POA: Diagnosis not present

## 2021-09-10 DIAGNOSIS — C61 Malignant neoplasm of prostate: Secondary | ICD-10-CM | POA: Diagnosis not present

## 2021-09-10 LAB — RAD ONC ARIA SESSION SUMMARY
Course Elapsed Days: 34
Plan Fractions Treated to Date: 24
Plan Prescribed Dose Per Fraction: 2.5 Gy
Plan Total Fractions Prescribed: 28
Plan Total Prescribed Dose: 70 Gy
Reference Point Dosage Given to Date: 60 Gy
Reference Point Session Dosage Given: 2.5 Gy
Session Number: 24

## 2021-09-11 ENCOUNTER — Other Ambulatory Visit: Payer: Self-pay

## 2021-09-11 ENCOUNTER — Ambulatory Visit
Admission: RE | Admit: 2021-09-11 | Discharge: 2021-09-11 | Disposition: A | Discharge status: Home / Self Care | Payer: BC Managed Care – PPO | Source: Ambulatory Visit | Attending: Radiation Oncology | Admitting: Radiation Oncology

## 2021-09-11 DIAGNOSIS — Z510 Encounter for antineoplastic radiation therapy: Secondary | ICD-10-CM | POA: Diagnosis not present

## 2021-09-11 DIAGNOSIS — Z191 Hormone sensitive malignancy status: Secondary | ICD-10-CM | POA: Diagnosis not present

## 2021-09-11 DIAGNOSIS — C61 Malignant neoplasm of prostate: Secondary | ICD-10-CM | POA: Diagnosis not present

## 2021-09-11 LAB — RAD ONC ARIA SESSION SUMMARY
Course Elapsed Days: 35
Plan Fractions Treated to Date: 25
Plan Prescribed Dose Per Fraction: 2.5 Gy
Plan Total Fractions Prescribed: 28
Plan Total Prescribed Dose: 70 Gy
Reference Point Dosage Given to Date: 62.5 Gy
Reference Point Session Dosage Given: 2.5 Gy
Session Number: 25

## 2021-09-12 ENCOUNTER — Other Ambulatory Visit: Payer: Self-pay

## 2021-09-12 ENCOUNTER — Ambulatory Visit
Admission: RE | Admit: 2021-09-12 | Discharge: 2021-09-12 | Disposition: A | Discharge status: Home / Self Care | Payer: BC Managed Care – PPO | Source: Ambulatory Visit | Attending: Radiation Oncology | Admitting: Radiation Oncology

## 2021-09-12 DIAGNOSIS — Z191 Hormone sensitive malignancy status: Secondary | ICD-10-CM | POA: Diagnosis not present

## 2021-09-12 DIAGNOSIS — Z510 Encounter for antineoplastic radiation therapy: Secondary | ICD-10-CM | POA: Diagnosis not present

## 2021-09-12 DIAGNOSIS — C61 Malignant neoplasm of prostate: Secondary | ICD-10-CM | POA: Diagnosis not present

## 2021-09-12 LAB — RAD ONC ARIA SESSION SUMMARY
Course Elapsed Days: 36
Plan Fractions Treated to Date: 26
Plan Prescribed Dose Per Fraction: 2.5 Gy
Plan Total Fractions Prescribed: 28
Plan Total Prescribed Dose: 70 Gy
Reference Point Dosage Given to Date: 65 Gy
Reference Point Session Dosage Given: 2.5 Gy
Session Number: 26

## 2021-09-13 ENCOUNTER — Other Ambulatory Visit: Payer: Self-pay

## 2021-09-13 ENCOUNTER — Ambulatory Visit
Admission: RE | Admit: 2021-09-13 | Discharge: 2021-09-13 | Disposition: A | Discharge status: Home / Self Care | Payer: BC Managed Care – PPO | Source: Ambulatory Visit | Attending: Radiation Oncology | Admitting: Radiation Oncology

## 2021-09-13 DIAGNOSIS — C61 Malignant neoplasm of prostate: Secondary | ICD-10-CM | POA: Diagnosis not present

## 2021-09-13 DIAGNOSIS — Z510 Encounter for antineoplastic radiation therapy: Secondary | ICD-10-CM | POA: Diagnosis not present

## 2021-09-13 DIAGNOSIS — Z191 Hormone sensitive malignancy status: Secondary | ICD-10-CM | POA: Diagnosis not present

## 2021-09-13 LAB — RAD ONC ARIA SESSION SUMMARY
Course Elapsed Days: 37
Plan Fractions Treated to Date: 27
Plan Prescribed Dose Per Fraction: 2.5 Gy
Plan Total Fractions Prescribed: 28
Plan Total Prescribed Dose: 70 Gy
Reference Point Dosage Given to Date: 67.5 Gy
Reference Point Session Dosage Given: 2.5 Gy
Session Number: 27

## 2021-09-14 ENCOUNTER — Other Ambulatory Visit: Payer: Self-pay

## 2021-09-14 ENCOUNTER — Ambulatory Visit
Admission: RE | Admit: 2021-09-14 | Discharge: 2021-09-14 | Disposition: A | Discharge status: Home / Self Care | Payer: BC Managed Care – PPO | Source: Ambulatory Visit | Attending: Radiation Oncology | Admitting: Radiation Oncology

## 2021-09-14 ENCOUNTER — Encounter: Payer: Self-pay | Admitting: Urology

## 2021-09-14 DIAGNOSIS — Z510 Encounter for antineoplastic radiation therapy: Secondary | ICD-10-CM | POA: Diagnosis not present

## 2021-09-14 DIAGNOSIS — C61 Malignant neoplasm of prostate: Secondary | ICD-10-CM

## 2021-09-14 DIAGNOSIS — Z191 Hormone sensitive malignancy status: Secondary | ICD-10-CM | POA: Diagnosis not present

## 2021-09-14 LAB — RAD ONC ARIA SESSION SUMMARY
Course Elapsed Days: 38
Plan Fractions Treated to Date: 28
Plan Prescribed Dose Per Fraction: 2.5 Gy
Plan Total Fractions Prescribed: 28
Plan Total Prescribed Dose: 70 Gy
Reference Point Dosage Given to Date: 70 Gy
Reference Point Session Dosage Given: 2.5 Gy
Session Number: 28

## 2021-10-24 ENCOUNTER — Other Ambulatory Visit: Payer: Self-pay | Admitting: Internal Medicine

## 2021-10-26 ENCOUNTER — Encounter: Payer: Self-pay | Admitting: Internal Medicine

## 2021-10-26 ENCOUNTER — Telehealth: Payer: Self-pay | Admitting: Internal Medicine

## 2021-10-26 ENCOUNTER — Ambulatory Visit (INDEPENDENT_AMBULATORY_CARE_PROVIDER_SITE_OTHER): Payer: BC Managed Care – PPO | Admitting: Internal Medicine

## 2021-10-26 VITALS — BP 126/82 | HR 73 | Temp 98.0°F | Resp 18 | Ht 73.0 in | Wt 227.0 lb

## 2021-10-26 DIAGNOSIS — F5101 Primary insomnia: Secondary | ICD-10-CM

## 2021-10-26 DIAGNOSIS — F341 Dysthymic disorder: Secondary | ICD-10-CM | POA: Diagnosis not present

## 2021-10-26 DIAGNOSIS — Z23 Encounter for immunization: Secondary | ICD-10-CM | POA: Diagnosis not present

## 2021-10-26 NOTE — Telephone Encounter (Signed)
Requesting: Ambien '10mg'$   Contract: 10/24/20 UDS: None  Last Visit: 10/26/21 Next Visit: 03/11/22 Last Refill: 04/27/21 #30 and 3RF  Please Advise

## 2021-10-26 NOTE — Patient Instructions (Addendum)
Recommend to proceed with covid booster (bivalent) at your pharmacy.  Flu shot this fall.   Add MELATONIN 4 to  8 mg every night   HEALTHY SLEEP Sleep hygiene: Basic rules for a good night's sleep  Sleep only as much as you need to feel rested and then get out of bed  Keep a regular sleep schedule  Avoid forcing sleep  Exercise regularly for at least 20 minutes, preferably 4 to 5 hours before bedtime  Avoid caffeinated beverages after lunch  Avoid alcohol near bedtime: no "night cap"  Avoid smoking, especially in the evening  Do not go to bed hungry  Adjust bedroom environment  Avoid prolonged use of light-emitting screens before bedtime   Deal with your worries before bedtime     GO TO THE FRONT DESK, PLEASE SCHEDULE YOUR APPOINTMENTS Come back for  a physical in 4 months

## 2021-10-26 NOTE — Progress Notes (Signed)
   Subjective:    Patient ID: Michael Berg, male    DOB: 1965/12/14, 56 y.o.   MRN: [id number]  DOS:  10/26/2021 Type of visit - description: f/u   Since the last visit, he completed radiation therapy for prostate cancer. Still somewhat fatigued. Had LUTS, much improved after he finished XRT and was prescribed tamsulosin. Emotionally doing okay although admits to some stress, work-related.  Review of Systems See above   Past Medical History:  Diagnosis Date   Anxiety and depression    Dyslipidemia    History of COVID-19 2021   per pt moderate symptoms , recovered at home,  that resolved   Insomnia    Malignant neoplasm prostate [facility]) 12/2020   urologist--  dr pace/  radiation onology-- dr Tammi Klippel;   dx 10/ 2022,  Gleason 3+ 3,  PSA 3.81    Past Surgical History:  Procedure Laterality Date   [place] N/A 07/20/2021   Procedure: GOLD SEED IMPLANT;  Surgeon: Ardis Hughs, MD;  Location: [facility];  Service: Urology;  Laterality: N/A;  [place] N/A 07/20/2021   Procedure: SPACE OAR INSTILLATION;  Surgeon: Ardis Hughs, MD;  Location: [facility];  Service: Urology;  Laterality: N/A;    Current Outpatient Medications  Medication Instructions   atorvastatin (LIPITOR) 80 MG tablet TAKE 1 TABLET BY MOUTH EVERY DAY   cetirizine (ZYRTEC) 10 mg, Oral, Daily PRN   fluticasone (FLONASE) 50 MCG/ACT nasal spray 2 sprays, Each Nare, Daily   Multiple Vitamin (MULTIVITAMIN) tablet 1 tablet, Oral, Daily,     tamsulosin (FLOMAX) 0.4 mg, Oral, Daily after supper   zolpidem (AMBIEN) 10 mg, Oral, At bedtime PRN       Objective:   Physical Exam BP 126/82   Pulse 73   Temp 98 F (36.7 C)   Resp 18   Ht '6\' 1"'$  (1.854 m)   Wt 227 lb (103 kg)   SpO2 98%   BMI 29.95 kg/m  General:   Well developed, NAD, BMI noted. HEENT:  Normocephalic . Face symmetric, atraumatic Lungs:  CTA B Normal  respiratory effort, no intercostal retractions, no accessory muscle use. Heart: RRR,  no murmur.  Lower extremities: no pretibial edema bilaterally  Skin: Not pale. Not jaundice Neurologic:  alert & oriented X3.  Speech normal, gait appropriate for age and unassisted Psych--  Cognition and judgment appear intact.  Cooperative with normal attention span and concentration.  Behavior appropriate. No anxious or depressed appearing.      Assessment    Assessment  (restablished 01-2016) anxiety depression insomnia  (divorce related in the past, sx resurface ~01-2016 work related) Hyperlipidemia FH CAD F age 70 Prostate ca 12-2020  PLAN: Prostate cancer: Finished XRT, still somewhat fatigued, radiation related LUTS much better, on Flomax. Anxiety, depression, insomnia: On one occasion, he called home and did not have any recollection of that , he thinks was a  Ambien side effect   self decrease dose to half tablet. Other factors affecting his sleep: - Stress at work,  - his fiance has a completely different sleep schedule and that is affecting the patient. We explored other medications but eventually decided to stick with Ambien and melatonin.  See AVS. Preventive care: Tdap today.  COVID booster and flu shot recommended. RTC CPX 4 months

## 2021-10-26 NOTE — Telephone Encounter (Signed)
PDMP okay, prescription sent 

## 2021-10-28 NOTE — Assessment & Plan Note (Signed)
Prostate cancer: Finished XRT, still somewhat fatigued, radiation related LUTS much better, on Flomax. Anxiety, depression, insomnia: On one occasion, he called home and did not have any recollection of that , he thinks was a  Ambien side effect   self decrease dose to half tablet. Other factors affecting his sleep: - Stress at work,  - his fiance has a completely different sleep schedule and that is affecting the patient. We explored other medications but eventually decided to stick with Ambien and melatonin.  See AVS. Preventive care: Tdap today.  COVID booster and flu shot recommended. RTC CPX 4 months

## 2021-10-29 ENCOUNTER — Encounter: Payer: Self-pay | Admitting: Urology

## 2021-10-29 NOTE — Progress Notes (Signed)
Telephone appointment. I verified patient's identity and began nursing interview. Patient reports some urinary weakness and polyuria that is improving w/ the use of Flomax. No other issues reported at this time.  Meaningful use complete. I-PSS score of 4-mild. Flomax as directed. Urology appt- None  Reminded patient of his 10:30am-11/01/21 telephone appointment w/ Ashlyn Bruning PA-C. I left my extension [phone] in case patient needs anything. Patient verbalized understanding.  Patient contact- [phone]

## 2021-11-01 ENCOUNTER — Ambulatory Visit
Admission: RE | Admit: 2021-11-01 | Discharge: 2021-11-01 | Disposition: A | Discharge status: Home / Self Care | Payer: BC Managed Care – PPO | Source: Ambulatory Visit | Attending: Radiation Oncology | Admitting: Radiation Oncology

## 2021-11-01 DIAGNOSIS — C61 Malignant neoplasm of prostate: Secondary | ICD-10-CM | POA: Insufficient documentation

## 2021-11-01 NOTE — Progress Notes (Signed)
Radiation Oncology         [phone] ________________________________  Name: SHAHIR KAREN MRN: [id number]  Date: 11/01/2021  DOB: September 30, 1965  Post Treatment Note  CC: Colon Branch, MD  Robley Fries, MD  Diagnosis:   56 y.o. gentleman with Stage T2a adenocarcinoma of the prostate with Gleason score of 3+3, and PSA of 3.81  Interval Since Last Radiation:  6.5 weeks   08/07/21 - 09/14/21:  The prostate was treated to 70 Gy in 28 fractions of 2.5 Gy   Narrative:  I spoke with the patient to conduct his routine scheduled 1 month follow up visit via telephone to spare the patient unnecessary potential exposure in the healthcare setting during the current COVID-19 pandemic.  The patient was notified in advance and gave permission to proceed with this visit format.  He tolerated radiation treatment relatively well with only minor urinary irritation and modest fatigue.  He did report increased frequency, urgency and dysuria despite taking Flomax daily as prescribed.  The dysuria was managed with AZO as needed.  He also noticed some mild skin irritation in the treatment field.                              On review of systems, the patient states that he is doing very well in general.  His LUTS continue to gradually improve and are almost back to his baseline at this point.  He specifically denies dysuria or gross hematuria and he has continued taking the Flomax daily as prescribed.  He reports a healthy appetite and is maintaining his weight.  He denies abdominal pain, nausea, vomiting, diarrhea or constipation.  His only concern at present is some difficulty obtaining and maintaining erections which was not really an issue prior to his diagnosis.  This is quite frustrating and depressing to him as it is truly affecting his quality of life.  Otherwise, he is pleased with his progress to date.  ALLERGIES:  is allergic to augmentin [amoxicillin-pot clavulanate].  Meds: Current Outpatient  Medications  Medication Sig Dispense Refill   atorvastatin (LIPITOR) 80 MG tablet TAKE 1 TABLET BY MOUTH EVERY DAY 90 tablet 1   cetirizine (ZYRTEC) 10 MG tablet Take 10 mg by mouth daily as needed for allergies.     fluticasone (FLONASE) 50 MCG/ACT nasal spray Place 2 sprays into both nostrils daily. (Patient taking differently: Place 2 sprays into both nostrils daily as needed.) 16 g 6   Multiple Vitamin (MULTIVITAMIN) tablet Take 1 tablet by mouth daily.       tamsulosin (FLOMAX) 0.4 MG CAPS capsule Take 1 capsule (0.4 mg total) by mouth daily after supper. 30 capsule 5   zolpidem (AMBIEN) 10 MG tablet TAKE 1 TABLET BY MOUTH AT BEDTIME AS NEEDED FOR SLEEP. 30 tablet 3   No current facility-administered medications for this encounter.    Physical Findings:  vitals were not taken for this visit.  Pain Assessment Pain Score: 0-No pain/10 Unable to assess due to telephone follow-up visit format.  Lab Findings: Lab Results  Component Value Date   WBC 4.5 04/27/2021   HGB 14.3 04/27/2021   HCT 43.2 04/27/2021   MCV 86.7 04/27/2021   PLT 223.0 04/27/2021     Radiographic Findings: No results found.  Impression/Plan: 1. 56 y.o. gentleman with Stage T2a adenocarcinoma of the prostate with Gleason score of 3+3, and PSA of 3.81. He will continue to follow up  with urology for ongoing PSA determinations but does not currently have any scheduled follow-up with Dr. Claudia Desanctis to his knowledge.  I advised that I would share this discussion with Dr. Claudia Desanctis and that he would likely hear from one of her staff in the next week or so to get a follow-up appointment with posttreatment PSA around October 2023.  He understands what to expect with regards to PSA monitoring going forward. I will look forward to following his response to treatment via correspondence with urology, and would be happy to continue to participate in his care if clinically indicated. I talked to the patient about what to expect in the  future, including his risk for erectile dysfunction and rectal bleeding.  He is already having some difficulty with erectile dysfunction which we discussed is most likely psychological at this time, but nonetheless frustrating and discouraging for him.  I encouraged him to call Dr. Claudia Desanctis, sooner than later, to inquire about a trial of PDE 5 inhibitors to help him regain his confidence.  I also advised him to call or return to our office if he has any questions regarding his previous radiation or possible radiation side effects. He was comfortable with this plan and will follow up as needed.     Nicholos Johns, PA-C

## 2021-11-01 NOTE — Progress Notes (Signed)
  Radiation Oncology         [phone]) [phone] ________________________________  Name: Michael Berg MRN: [id number]  Date: 09/14/2021  DOB: 1965/04/09  End of Treatment Note  Diagnosis:   56 y.o. gentleman with Stage T2a adenocarcinoma of the prostate with Gleason score of 3+3, and PSA of 3.81     Indication for treatment:  Curative, Definitive Radiotherapy       Radiation treatment dates:   08/07/21 - 09/14/21  Site/dose:   The prostate was treated to 70 Gy in 28 fractions of 2.5 Gy  Beams/energy:   The patient was treated with IMRT using volumetric arc therapy delivering 6 MV X-rays to clockwise and counterclockwise circumferential arcs with a 90 degree collimator offset to avoid dose scalloping.  Image guidance was performed with daily cone beam CT prior to each fraction to align to gold markers in the prostate and assure proper bladder and rectal fill volumes.  Immobilization was achieved with BodyFix custom mold.  Narrative: The patient tolerated radiation treatment relatively well with only minor urinary irritation and modest fatigue.  He did report increased frequency, urgency and dysuria despite taking Flomax daily as prescribed.  The dysuria was managed with AZO as needed.  He also noticed some mild skin irritation in the treatment field.  Plan: The patient has completed radiation treatment. He will return to radiation oncology clinic for routine followup in one month. I advised him to call or return sooner if he has any questions or concerns related to his recovery or treatment. ________________________________  Sheral Apley. Tammi Klippel, M.D.

## 2021-11-29 ENCOUNTER — Encounter: Payer: Self-pay | Admitting: *Deleted

## 2021-12-03 ENCOUNTER — Inpatient Hospital Stay (HOSPITAL_BASED_OUTPATIENT_CLINIC_OR_DEPARTMENT_OTHER): Payer: BC Managed Care – PPO | Admitting: *Deleted

## 2021-12-03 ENCOUNTER — Encounter: Payer: Self-pay | Admitting: *Deleted

## 2021-12-03 ENCOUNTER — Other Ambulatory Visit: Payer: Self-pay

## 2021-12-03 ENCOUNTER — Inpatient Hospital Stay: Payer: BC Managed Care – PPO | Attending: Licensed Clinical Social Worker | Admitting: General Practice

## 2021-12-03 VITALS — BP 116/88 | HR 72 | Temp 97.5°F | Resp 18 | Ht 73.0 in | Wt 221.5 lb

## 2021-12-03 DIAGNOSIS — C61 Malignant neoplasm of prostate: Secondary | ICD-10-CM

## 2021-12-03 NOTE — Progress Notes (Signed)
[facility] Spiritual Care Note  Met with Mr Mance as part of [facility].  Reviewed both HCPOA and Living Will forms with him, identifying questions that he would like to discuss with a physician prior to completing his AD paperwork.  Also provided emotional support related to themes that arose in conversation, including grief from the deaths of two younger siblings.  Mr Ishman plans to consult his oncologist and/or PCP for an Advance Care Planning appointment and then to register for a follow-up appointment in [facility]. He was also pleased to learn of availability of chaplain and counseling intern to process some of the emotional concerns that ACP stirs up and plans to reach out for follow-up conversation.   [place], [place], [facility] Pager [phone] Voicemail [phone]

## 2021-12-04 NOTE — Progress Notes (Signed)
SCP reviewed and completed. 

## 2021-12-14 ENCOUNTER — Encounter: Payer: Self-pay | Admitting: *Deleted

## 2021-12-17 DIAGNOSIS — Z23 Encounter for immunization: Secondary | ICD-10-CM | POA: Diagnosis not present

## 2021-12-25 DIAGNOSIS — C61 Malignant neoplasm of prostate: Secondary | ICD-10-CM | POA: Diagnosis not present

## 2022-01-10 DIAGNOSIS — U071 COVID-19: Secondary | ICD-10-CM | POA: Diagnosis not present

## 2022-01-10 DIAGNOSIS — J029 Acute pharyngitis, unspecified: Secondary | ICD-10-CM | POA: Diagnosis not present

## 2022-01-16 DIAGNOSIS — C61 Malignant neoplasm of prostate: Secondary | ICD-10-CM | POA: Diagnosis not present

## 2022-01-16 DIAGNOSIS — N401 Enlarged prostate with lower urinary tract symptoms: Secondary | ICD-10-CM | POA: Diagnosis not present

## 2022-01-16 DIAGNOSIS — R350 Frequency of micturition: Secondary | ICD-10-CM | POA: Diagnosis not present

## 2022-01-16 DIAGNOSIS — N5201 Erectile dysfunction due to arterial insufficiency: Secondary | ICD-10-CM | POA: Diagnosis not present

## 2022-02-13 ENCOUNTER — Other Ambulatory Visit: Payer: Self-pay | Admitting: Radiation Oncology

## 2022-03-09 ENCOUNTER — Other Ambulatory Visit: Payer: Self-pay | Admitting: Internal Medicine

## 2022-03-11 ENCOUNTER — Encounter: Payer: Self-pay | Admitting: Internal Medicine

## 2022-03-11 ENCOUNTER — Ambulatory Visit (INDEPENDENT_AMBULATORY_CARE_PROVIDER_SITE_OTHER): Payer: BC Managed Care – PPO | Admitting: Internal Medicine

## 2022-03-11 VITALS — BP 122/80 | HR 103 | Temp 97.9°F | Resp 16 | Ht 73.0 in | Wt 229.0 lb

## 2022-03-11 DIAGNOSIS — E785 Hyperlipidemia, unspecified: Secondary | ICD-10-CM | POA: Diagnosis not present

## 2022-03-11 DIAGNOSIS — Z1211 Encounter for screening for malignant neoplasm of colon: Secondary | ICD-10-CM

## 2022-03-11 DIAGNOSIS — Z0000 Encounter for general adult medical examination without abnormal findings: Secondary | ICD-10-CM

## 2022-03-11 LAB — LIPID PANEL
Cholesterol: 153 mg/dL (ref 0–200)
HDL: 47.7 mg/dL (ref >39.00)
LDL Cholesterol: 94 mg/dL (ref 0–99)
NonHDL: 105.45
Total CHOL/HDL Ratio: 3
Triglycerides: 55 mg/dL (ref 0.0–149.0)
VLDL: 11 mg/dL (ref 0.0–40.0)

## 2022-03-11 LAB — BASIC METABOLIC PANEL
BUN: 16 mg/dL (ref 6–23)
CO2: 28 mEq/L (ref 19–32)
Calcium: 9.3 mg/dL (ref 8.4–10.5)
Chloride: 105 mEq/L (ref 96–112)
Creatinine, Ser: 1.06 mg/dL (ref 0.40–1.50)
GFR: 79.67 mL/min (ref >60.00)
Glucose, Bld: 105 mg/dL — ABNORMAL HIGH (ref 70–99)
Potassium: 4.6 mEq/L (ref 3.5–5.1)
Sodium: 140 mEq/L (ref 135–145)

## 2022-03-11 LAB — HEMOGLOBIN A1C: Hgb A1c MFr Bld: 5.5 % (ref 4.6–6.5)

## 2022-03-11 MED ORDER — ZOLPIDEM TARTRATE 10 MG PO TABS: 10.0000 mg | ORAL_TABLET | Freq: Every evening | ORAL | 3 refills | Status: DC | PRN

## 2022-03-11 MED ORDER — TAMSULOSIN HCL 0.4 MG PO CAPS: 0.4000 mg | ORAL_CAPSULE | Freq: Every day | ORAL | 3 refills | Status: DC

## 2022-03-11 NOTE — Assessment & Plan Note (Signed)
Here for CPX Anxiety, depression, insomnia: On Ambien as needed, usually takes half tablet, PDMP okay, RF sent.  Seems to be doing well Hyperlipidemia: On atorvastatin 80 mg daily, high-dose.  Tolerates well, check labs. Prostate cancer: Follow-up by urology RTC 1 year sooner if needed.

## 2022-03-11 NOTE — Assessment & Plan Note (Signed)
-  Td 2023 -s/p shingrex x 2 - covid vax  booster is an option - flu shot : done  --CCS: never had a cscope, neg cologuard 06/2019, extensive discussion about further steps, he would like to have a colonoscopy, referred to GI. -prostate cancer dx 12-2020 -Diet and exercise discussed -Available labs reviewed, will check a BMP FLP A1c

## 2022-03-11 NOTE — Progress Notes (Signed)
Subjective:    Patient ID: Michael Berg, male    DOB: [birth year], 56 y.o.   MRN: [id number]  DOS:  03/11/2022 Type of visit - description: CPX  Here for CPX Doing well He does have occasional insomnia. Denies any LUTS.  Review of Systems  Other than above, a 14 point review of systems is negative     Past Medical History:  Diagnosis Date   Anxiety and depression    Dyslipidemia    History of COVID-19 2021   per pt moderate symptoms , recovered at home,  that resolved   Insomnia    Malignant neoplasm prostate [facility]) 12/2020   urologist--  dr pace/  radiation onology-- dr Tammi Klippel;   dx 10/ 2022,  Gleason 3+ 3,  PSA 3.81    Past Surgical History:  Procedure Laterality Date   [place] N/A 07/20/2021   Procedure: GOLD SEED IMPLANT;  Surgeon: Ardis Hughs, MD;  Location: [facility];  Service: Urology;  Laterality: N/A;  [place] N/A 07/20/2021   Procedure: SPACE OAR INSTILLATION;  Surgeon: Ardis Hughs, MD;  Location: [facility];  Service: Urology;  Laterality: N/A;   Social History   Socioeconomic History   Marital status: Divorced    Spouse name: Not on file   Number of children: 2   Years of education: Not on file   Highest education level: Not on file  Occupational History   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  Tobacco Use   Smoking status: Former    Years: 5.00    Types: Cigarettes    Quit date: 1995    Years since quitting: 28.9   Smokeless tobacco: Never  Vaping Use   Vaping Use: Never used  Substance and Sexual Activity   Alcohol use: Yes    Comment: occasional   Drug use: Not Currently    Comment: 07-17-2021  per pt last smoked "weed"  approx 1990   Sexual activity: Yes  Other Topics Concern   Not on file  Social History Narrative   Household- pt and fiancee       Social Determinants of Health   Financial  Resource Strain: Not on file  Food Insecurity: No Food Insecurity (04/24/2021)   Hunger Vital Sign    Worried About Running Out of Food in the Last Year: Never true    Ran Out of Food in the Last Year: Never true  Transportation Needs: No Transportation Needs (04/24/2021)   PRAPARE - Hydrologist (Medical): No    Lack of Transportation (Non-Medical): No  Physical Activity: Not on file  Stress: Not on file  Social Connections: Not on file  Intimate Partner Violence: Not on file     Current Outpatient Medications  Medication Instructions   atorvastatin (LIPITOR) 80 mg, Oral, Daily   cetirizine (ZYRTEC) 10 mg, Oral, Daily PRN   fluticasone (FLONASE) 50 MCG/ACT nasal spray 2 sprays, Each Nare, Daily   Multiple Vitamin (MULTIVITAMIN) tablet 1 tablet, Oral, Daily,     tamsulosin (FLOMAX) 0.4 mg, Oral, Daily after supper   zolpidem (AMBIEN) 10 mg, Oral, At bedtime PRN, for sleep       Objective:   Physical Exam BP 122/80   Pulse (!) 103   Temp 97.9 F (36.6 C) (Oral)   Resp 16   Ht '6\' 1"'$  (1.854 m)  Wt 229 lb (103.9 kg)   SpO2 97%   BMI 30.21 kg/m  General: Well developed, NAD, BMI noted Neck: No  thyromegaly  HEENT:  Normocephalic . Face symmetric, atraumatic Lungs:  CTA B Normal respiratory effort, no intercostal retractions, no accessory muscle use. Heart: RRR,  no murmur.  Abdomen:  Not distended, soft, non-tender. No rebound or rigidity.   Lower extremities: no pretibial edema bilaterally  Skin: Exposed areas without rash. Not pale. Not jaundice Neurologic:  alert & oriented X3.  Speech normal, gait appropriate for age and unassisted Strength symmetric and appropriate for age.  Psych: Cognition and judgment appear intact.  Cooperative with normal attention span and concentration.  Behavior appropriate. No anxious or depressed appearing.     Assessment    Assessment  (restablished 01-2016) anxiety depression insomnia  (divorce  related in the past, sx resurface ~01-2016 work related) Hyperlipidemia FH CAD F age 89 Prostate ca 12-2020  PLAN: Here for CPX Anxiety, depression, insomnia: On Ambien as needed, usually takes half tablet, PDMP okay, RF sent.  Seems to be doing well Hyperlipidemia: On atorvastatin 80 mg daily, high-dose.  Tolerates well, check labs. Prostate cancer: Follow-up by urology RTC 1 year sooner if needed.

## 2022-03-11 NOTE — Patient Instructions (Addendum)
  Colon cancer screening: Your Cologuard came back  negative  July 14, 2019.  You need another screening April 2024 We are referring you to gastroenterology for the consideration of a colonoscopy. Please call them at [phone]    GO TO THE LAB : Get the blood work     [place], [place] Come back for physical exam in 1 year, sooner if needed

## 2022-04-05 DIAGNOSIS — C61 Malignant neoplasm of prostate: Secondary | ICD-10-CM | POA: Diagnosis not present

## 2022-04-05 LAB — PSA: PSA: 0.98

## 2022-04-12 DIAGNOSIS — R350 Frequency of micturition: Secondary | ICD-10-CM | POA: Diagnosis not present

## 2022-04-12 DIAGNOSIS — N5201 Erectile dysfunction due to arterial insufficiency: Secondary | ICD-10-CM | POA: Diagnosis not present

## 2022-04-12 DIAGNOSIS — N401 Enlarged prostate with lower urinary tract symptoms: Secondary | ICD-10-CM | POA: Diagnosis not present

## 2022-04-12 DIAGNOSIS — C61 Malignant neoplasm of prostate: Secondary | ICD-10-CM | POA: Diagnosis not present

## 2022-04-16 ENCOUNTER — Encounter: Payer: Self-pay | Admitting: Internal Medicine

## 2022-06-20 DIAGNOSIS — M7918 Myalgia, other site: Secondary | ICD-10-CM | POA: Diagnosis not present

## 2022-06-20 DIAGNOSIS — M9904 Segmental and somatic dysfunction of sacral region: Secondary | ICD-10-CM | POA: Diagnosis not present

## 2022-06-20 DIAGNOSIS — M9902 Segmental and somatic dysfunction of thoracic region: Secondary | ICD-10-CM | POA: Diagnosis not present

## 2022-06-20 DIAGNOSIS — M9903 Segmental and somatic dysfunction of lumbar region: Secondary | ICD-10-CM | POA: Diagnosis not present

## 2022-06-20 DIAGNOSIS — M25612 Stiffness of left shoulder, not elsewhere classified: Secondary | ICD-10-CM | POA: Diagnosis not present

## 2022-06-20 DIAGNOSIS — M25611 Stiffness of right shoulder, not elsewhere classified: Secondary | ICD-10-CM | POA: Diagnosis not present

## 2022-06-20 DIAGNOSIS — M9901 Segmental and somatic dysfunction of cervical region: Secondary | ICD-10-CM | POA: Diagnosis not present

## 2022-06-26 DIAGNOSIS — M9902 Segmental and somatic dysfunction of thoracic region: Secondary | ICD-10-CM | POA: Diagnosis not present

## 2022-06-26 DIAGNOSIS — M9904 Segmental and somatic dysfunction of sacral region: Secondary | ICD-10-CM | POA: Diagnosis not present

## 2022-06-26 DIAGNOSIS — M9903 Segmental and somatic dysfunction of lumbar region: Secondary | ICD-10-CM | POA: Diagnosis not present

## 2022-06-26 DIAGNOSIS — M9901 Segmental and somatic dysfunction of cervical region: Secondary | ICD-10-CM | POA: Diagnosis not present

## 2022-08-02 DIAGNOSIS — C61 Malignant neoplasm of prostate: Secondary | ICD-10-CM | POA: Diagnosis not present

## 2022-08-02 LAB — PSA: PSA: 1.05

## 2022-08-09 DIAGNOSIS — C61 Malignant neoplasm of prostate: Secondary | ICD-10-CM | POA: Diagnosis not present

## 2022-08-09 DIAGNOSIS — N401 Enlarged prostate with lower urinary tract symptoms: Secondary | ICD-10-CM | POA: Diagnosis not present

## 2022-08-09 DIAGNOSIS — N5201 Erectile dysfunction due to arterial insufficiency: Secondary | ICD-10-CM | POA: Diagnosis not present

## 2022-08-09 DIAGNOSIS — R350 Frequency of micturition: Secondary | ICD-10-CM | POA: Diagnosis not present

## 2022-08-12 ENCOUNTER — Encounter: Payer: Self-pay | Admitting: Internal Medicine

## 2022-08-26 ENCOUNTER — Telehealth: Payer: Self-pay | Admitting: Internal Medicine

## 2022-08-26 DIAGNOSIS — M9904 Segmental and somatic dysfunction of sacral region: Secondary | ICD-10-CM | POA: Diagnosis not present

## 2022-08-26 DIAGNOSIS — M9905 Segmental and somatic dysfunction of pelvic region: Secondary | ICD-10-CM | POA: Diagnosis not present

## 2022-08-26 DIAGNOSIS — M9903 Segmental and somatic dysfunction of lumbar region: Secondary | ICD-10-CM | POA: Diagnosis not present

## 2022-08-26 DIAGNOSIS — M9902 Segmental and somatic dysfunction of thoracic region: Secondary | ICD-10-CM | POA: Diagnosis not present

## 2022-08-27 NOTE — Telephone Encounter (Signed)
PDMP okay, Rx sent 

## 2022-08-27 NOTE — Telephone Encounter (Signed)
Requesting: Ambien 10mg   Contract: 03/21/22 UDS: Ambien only Last Visit: 03/11/22 Next Visit: 03/14/23 Last Refill: 03/11/22 #30 and 3RF   Please Advise

## 2022-08-29 DIAGNOSIS — M545 Low back pain, unspecified: Secondary | ICD-10-CM | POA: Diagnosis not present

## 2022-08-30 DIAGNOSIS — M9904 Segmental and somatic dysfunction of sacral region: Secondary | ICD-10-CM | POA: Diagnosis not present

## 2022-08-30 DIAGNOSIS — M9905 Segmental and somatic dysfunction of pelvic region: Secondary | ICD-10-CM | POA: Diagnosis not present

## 2022-08-30 DIAGNOSIS — M9903 Segmental and somatic dysfunction of lumbar region: Secondary | ICD-10-CM | POA: Diagnosis not present

## 2022-08-30 DIAGNOSIS — M9902 Segmental and somatic dysfunction of thoracic region: Secondary | ICD-10-CM | POA: Diagnosis not present

## 2022-09-04 ENCOUNTER — Other Ambulatory Visit: Payer: Self-pay | Admitting: Internal Medicine

## 2022-09-06 DIAGNOSIS — M9905 Segmental and somatic dysfunction of pelvic region: Secondary | ICD-10-CM | POA: Diagnosis not present

## 2022-09-06 DIAGNOSIS — M9904 Segmental and somatic dysfunction of sacral region: Secondary | ICD-10-CM | POA: Diagnosis not present

## 2022-09-06 DIAGNOSIS — M9903 Segmental and somatic dysfunction of lumbar region: Secondary | ICD-10-CM | POA: Diagnosis not present

## 2022-09-06 DIAGNOSIS — M9902 Segmental and somatic dysfunction of thoracic region: Secondary | ICD-10-CM | POA: Diagnosis not present

## 2022-12-06 DIAGNOSIS — C61 Malignant neoplasm of prostate: Secondary | ICD-10-CM | POA: Diagnosis not present

## 2022-12-06 LAB — PSA: PSA: 0.73

## 2022-12-20 DIAGNOSIS — N401 Enlarged prostate with lower urinary tract symptoms: Secondary | ICD-10-CM | POA: Diagnosis not present

## 2022-12-20 DIAGNOSIS — C61 Malignant neoplasm of prostate: Secondary | ICD-10-CM | POA: Diagnosis not present

## 2022-12-20 DIAGNOSIS — R350 Frequency of micturition: Secondary | ICD-10-CM | POA: Diagnosis not present

## 2022-12-23 ENCOUNTER — Encounter: Payer: Self-pay | Admitting: Internal Medicine

## 2023-01-22 DIAGNOSIS — M9905 Segmental and somatic dysfunction of pelvic region: Secondary | ICD-10-CM | POA: Diagnosis not present

## 2023-01-22 DIAGNOSIS — M9902 Segmental and somatic dysfunction of thoracic region: Secondary | ICD-10-CM | POA: Diagnosis not present

## 2023-01-22 DIAGNOSIS — M9903 Segmental and somatic dysfunction of lumbar region: Secondary | ICD-10-CM | POA: Diagnosis not present

## 2023-01-22 DIAGNOSIS — M9904 Segmental and somatic dysfunction of sacral region: Secondary | ICD-10-CM | POA: Diagnosis not present

## 2023-02-24 ENCOUNTER — Telehealth: Payer: Self-pay | Admitting: Internal Medicine

## 2023-02-24 NOTE — Telephone Encounter (Signed)
Requesting: Ambien 10mg   Contract:  03/21/22 UDS: 10/24/20 Last Visit: 03/11/22 Next Visit: 03/14/23 Last Refill: 08/27/22 #30 and 4RF   Please Advise

## 2023-02-24 NOTE — Telephone Encounter (Signed)
PDMP okay, Rx sent 

## 2023-03-11 DIAGNOSIS — M9905 Segmental and somatic dysfunction of pelvic region: Secondary | ICD-10-CM | POA: Diagnosis not present

## 2023-03-11 DIAGNOSIS — M9902 Segmental and somatic dysfunction of thoracic region: Secondary | ICD-10-CM | POA: Diagnosis not present

## 2023-03-11 DIAGNOSIS — M9903 Segmental and somatic dysfunction of lumbar region: Secondary | ICD-10-CM | POA: Diagnosis not present

## 2023-03-11 DIAGNOSIS — M9904 Segmental and somatic dysfunction of sacral region: Secondary | ICD-10-CM | POA: Diagnosis not present

## 2023-03-14 ENCOUNTER — Encounter: Payer: Self-pay | Admitting: Internal Medicine

## 2023-03-14 ENCOUNTER — Ambulatory Visit (INDEPENDENT_AMBULATORY_CARE_PROVIDER_SITE_OTHER): Payer: BC Managed Care – PPO | Admitting: Internal Medicine

## 2023-03-14 VITALS — BP 122/80 | HR 74 | Temp 98.0°F | Resp 16 | Ht 73.0 in | Wt 223.0 lb

## 2023-03-14 DIAGNOSIS — Z0000 Encounter for general adult medical examination without abnormal findings: Secondary | ICD-10-CM | POA: Diagnosis not present

## 2023-03-14 DIAGNOSIS — Z23 Encounter for immunization: Secondary | ICD-10-CM | POA: Diagnosis not present

## 2023-03-14 DIAGNOSIS — E785 Hyperlipidemia, unspecified: Secondary | ICD-10-CM | POA: Diagnosis not present

## 2023-03-14 DIAGNOSIS — Z1211 Encounter for screening for malignant neoplasm of colon: Secondary | ICD-10-CM

## 2023-03-14 LAB — CBC WITH DIFFERENTIAL/PLATELET
Basophils Absolute: 0 10*3/uL (ref 0.0–0.1)
Basophils Relative: 0.5 % (ref 0.0–3.0)
Eosinophils Absolute: 0 10*3/uL (ref 0.0–0.7)
Eosinophils Relative: 1.1 % (ref 0.0–5.0)
HCT: 43.3 % (ref 39.0–52.0)
Hemoglobin: 14.4 g/dL (ref 13.0–17.0)
Lymphocytes Relative: 23.6 % (ref 12.0–46.0)
Lymphs Abs: 0.9 10*3/uL (ref 0.7–4.0)
MCHC: 33.2 g/dL (ref 30.0–36.0)
MCV: 89.2 fL (ref 78.0–100.0)
Monocytes Absolute: 0.3 10*3/uL (ref 0.1–1.0)
Monocytes Relative: 7.9 % (ref 3.0–12.0)
Neutro Abs: 2.7 10*3/uL (ref 1.4–7.7)
Neutrophils Relative %: 66.9 % (ref 43.0–77.0)
Platelets: 246 10*3/uL (ref 150.0–400.0)
RBC: 4.85 Mil/uL (ref 4.22–5.81)
RDW: 13.2 % (ref 11.5–15.5)
WBC: 4 10*3/uL (ref 4.0–10.5)

## 2023-03-14 LAB — COMPREHENSIVE METABOLIC PANEL
ALT: 29 U/L (ref 0–53)
AST: 19 U/L (ref 0–37)
Albumin: 4.4 g/dL (ref 3.5–5.2)
Alkaline Phosphatase: 49 U/L (ref 39–117)
BUN: 19 mg/dL (ref 6–23)
CO2: 30 meq/L (ref 19–32)
Calcium: 9 mg/dL (ref 8.4–10.5)
Chloride: 105 meq/L (ref 96–112)
Creatinine, Ser: 1.01 mg/dL (ref 0.40–1.50)
GFR: 83.83 mL/min (ref >60.00)
Glucose, Bld: 94 mg/dL (ref 70–99)
Potassium: 4.9 meq/L (ref 3.5–5.1)
Sodium: 141 meq/L (ref 135–145)
Total Bilirubin: 0.7 mg/dL (ref 0.2–1.2)
Total Protein: 6.6 g/dL (ref 6.0–8.3)

## 2023-03-14 LAB — HEMOGLOBIN A1C: Hgb A1c MFr Bld: 5.7 % (ref 4.6–6.5)

## 2023-03-14 LAB — LIPID PANEL
Cholesterol: 149 mg/dL (ref 0–200)
HDL: 40.6 mg/dL (ref >39.00)
LDL Cholesterol: 97 mg/dL (ref 0–99)
NonHDL: 108.03
Total CHOL/HDL Ratio: 4
Triglycerides: 57 mg/dL (ref 0.0–149.0)
VLDL: 11.4 mg/dL (ref 0.0–40.0)

## 2023-03-14 MED ORDER — TRAZODONE HCL 50 MG PO TABS: 25.0000 mg | ORAL_TABLET | Freq: Every day | ORAL | 0 refills | Status: DC

## 2023-03-14 NOTE — Progress Notes (Unsigned)
Subjective:    Patient ID: Michael Berg, male    DOB: [birth year], 57 y.o.   MRN: [id number]  DOS:  03/14/2023 Type of visit - description: Here for CPX  In general feeling well. Would like to change Ambien to trazodone. Sees a chiropractor from time to time for back pain.  Review of Systems See above   Past Medical History:  Diagnosis Date   Anxiety and depression    Dyslipidemia    History of COVID-19 2021   per pt moderate symptoms , recovered at home,  that resolved   Insomnia    Malignant neoplasm prostate [facility]) 12/2020   urologist--  dr pace/  radiation onology-- dr Kathrynn Running;   dx 10/ 2022,  Gleason 3+ 3,  PSA 3.81    Past Surgical History:  Procedure Laterality Date   APPENDECTOMY  1982   GOLD SEED IMPLANT N/A 07/20/2021   Procedure: GOLD SEED IMPLANT;  Surgeon: Crist Fat, MD;  Location: [facility];  Service: Urology;  Laterality: N/A;  30 MINS   SPACE OAR INSTILLATION N/A 07/20/2021   Procedure: SPACE OAR INSTILLATION;  Surgeon: Crist Fat, MD;  Location: [facility];  Service: Urology;  Laterality: N/A;    Current Outpatient Medications  Medication Instructions   atorvastatin (LIPITOR) 80 mg, Oral, Daily   cetirizine (ZYRTEC) 10 mg, Daily PRN   fluticasone (FLONASE) 50 MCG/ACT nasal spray 2 sprays, Each Nare, Daily   Multiple Vitamin (MULTIVITAMIN) tablet 1 tablet, Daily   tamsulosin (FLOMAX) 0.4 mg, Oral, Daily after supper   zolpidem (AMBIEN) 10 mg, Oral, At bedtime PRN, for sleep       Objective:   Physical Exam BP 122/80   Pulse 74   Temp 98 F (36.7 C) (Oral)   Resp 16   Ht 6\' 1"  (1.854 m)   Wt 223 lb (101.2 kg)   SpO2 98%   BMI 29.42 kg/m  General: Well developed, NAD, BMI noted Neck: No  thyromegaly  HEENT:  Normocephalic . Face symmetric, atraumatic Lungs:  CTA B Normal respiratory effort, no intercostal retractions, no accessory muscle use. Heart: RRR,  no murmur.  Abdomen:  Not  distended, soft, non-tender. No rebound or rigidity.   Lower extremities: no pretibial edema bilaterally  Skin: Exposed areas without rash. Not pale. Not jaundice Neurologic:  alert & oriented X3.  Speech normal, gait appropriate for age and unassisted Strength symmetric and appropriate for age.  Psych: Cognition and judgment appear intact.  Cooperative with normal attention span and concentration.  Behavior appropriate. No anxious or depressed appearing.     Assessment    Assessment  (restablished 01-2016) anxiety depression insomnia  (divorce related in the past, sx resurface ~01-2016 work related) Hyperlipidemia FH CAD F age 45 Prostate ca 12-2020, S/p external beam radiation completed June 2023, sees urology  PLAN: Here for CPX -Td 2023 -s/p shingrex x 2 -Flu shot today - COVID-vaccine recommended. --CCS: never had a cscope, neg cologuard 06/2019, this year he is electing to proceed with a colonoscopy, referred to GI. -prostate cancer dx 12-2020, per urology -Diet and exercise discussed -Labs: CMP CBC FLP A1c Anxiety, depression, insomnia: Currently on Ambien, 1 night he make a phone call and he did not recall making it.  Would like to switch Ambien to trazodone.  Prescription sent, start with a very low dose and gradually go up.  See AVS. Hyperlipidemia: On atorvastatin 80, checking labs. RTC 1 year    Anxiety,  depression, insomnia: On Ambien as needed, usually takes half tablet, PDMP okay, RF sent.  Seems to be doing well Hyperlipidemia: On atorvastatin 80 mg daily, high-dose.  Tolerates well, check labs. Prostate cancer: Follow-up by urology RTC 1 year sooner if needed.

## 2023-03-14 NOTE — Patient Instructions (Addendum)
We are referring you to the gastroenterology office to get a colonoscopy. You can reach them at [phone]  For insomnia: - Stop Ambien - Start trazodone 50 mg tablet.  You can take either half, one  or even 1.5 tablets at bedtime to help you sleep   Vaccines I recommend: Covid booster    GO TO THE LAB : Get the blood work     Next visit with me in 1 year    Please schedule it at the front desk

## 2023-03-15 ENCOUNTER — Encounter: Payer: Self-pay | Admitting: Internal Medicine

## 2023-03-15 NOTE — Assessment & Plan Note (Signed)
Here for CPX  Anxiety, depression, insomnia: Currently on Ambien, 1 night he made a phone call and he did not recall making it, thinks could have been a [place] s/e, likes to switch Ambien to trazodone.  Prescription sent, start with a very low dose and gradually go up.  See AVS. Hyperlipidemia: On atorvastatin 80, checking labs. RTC 1 year

## 2023-03-15 NOTE — Assessment & Plan Note (Signed)
Here for CPX -Td 2023 -s/p shingrex x 2 -Flu shot today - COVID-vaccine recommended. --CCS: never had a cscope, neg cologuard 06/2019, this year he is electing to proceed with a colonoscopy, referred to GI. -prostate cancer dx 12-2020, per urology -Diet and exercise discussed -Labs: CMP CBC FLP A1c

## 2023-03-17 DIAGNOSIS — M9904 Segmental and somatic dysfunction of sacral region: Secondary | ICD-10-CM | POA: Diagnosis not present

## 2023-03-17 DIAGNOSIS — M9903 Segmental and somatic dysfunction of lumbar region: Secondary | ICD-10-CM | POA: Diagnosis not present

## 2023-03-17 DIAGNOSIS — M9905 Segmental and somatic dysfunction of pelvic region: Secondary | ICD-10-CM | POA: Diagnosis not present

## 2023-03-17 DIAGNOSIS — M9902 Segmental and somatic dysfunction of thoracic region: Secondary | ICD-10-CM | POA: Diagnosis not present

## 2023-04-08 ENCOUNTER — Other Ambulatory Visit: Payer: Self-pay | Admitting: Internal Medicine

## 2023-04-18 DIAGNOSIS — R350 Frequency of micturition: Secondary | ICD-10-CM | POA: Diagnosis not present

## 2023-04-18 DIAGNOSIS — N401 Enlarged prostate with lower urinary tract symptoms: Secondary | ICD-10-CM | POA: Diagnosis not present

## 2023-04-18 LAB — PSA: PSA: 0.8

## 2023-04-25 DIAGNOSIS — C61 Malignant neoplasm of prostate: Secondary | ICD-10-CM | POA: Diagnosis not present

## 2023-04-25 DIAGNOSIS — N5201 Erectile dysfunction due to arterial insufficiency: Secondary | ICD-10-CM | POA: Diagnosis not present

## 2023-04-25 DIAGNOSIS — F524 Premature ejaculation: Secondary | ICD-10-CM | POA: Diagnosis not present

## 2023-04-28 ENCOUNTER — Encounter: Payer: Self-pay | Admitting: Internal Medicine

## 2023-07-15 ENCOUNTER — Other Ambulatory Visit: Payer: Self-pay | Admitting: Internal Medicine

## 2023-08-15 DIAGNOSIS — C61 Malignant neoplasm of prostate: Secondary | ICD-10-CM | POA: Diagnosis not present

## 2023-08-15 LAB — PSA: PSA: 0.97

## 2023-08-27 DIAGNOSIS — M9901 Segmental and somatic dysfunction of cervical region: Secondary | ICD-10-CM | POA: Diagnosis not present

## 2023-08-27 DIAGNOSIS — M9903 Segmental and somatic dysfunction of lumbar region: Secondary | ICD-10-CM | POA: Diagnosis not present

## 2023-08-27 DIAGNOSIS — M9902 Segmental and somatic dysfunction of thoracic region: Secondary | ICD-10-CM | POA: Diagnosis not present

## 2023-08-27 DIAGNOSIS — M9905 Segmental and somatic dysfunction of pelvic region: Secondary | ICD-10-CM | POA: Diagnosis not present

## 2023-09-04 DIAGNOSIS — M9903 Segmental and somatic dysfunction of lumbar region: Secondary | ICD-10-CM | POA: Diagnosis not present

## 2023-09-04 DIAGNOSIS — M9901 Segmental and somatic dysfunction of cervical region: Secondary | ICD-10-CM | POA: Diagnosis not present

## 2023-09-04 DIAGNOSIS — M9905 Segmental and somatic dysfunction of pelvic region: Secondary | ICD-10-CM | POA: Diagnosis not present

## 2023-09-04 DIAGNOSIS — M9902 Segmental and somatic dysfunction of thoracic region: Secondary | ICD-10-CM | POA: Diagnosis not present

## 2023-09-24 DIAGNOSIS — C61 Malignant neoplasm of prostate: Secondary | ICD-10-CM | POA: Diagnosis not present

## 2023-09-24 DIAGNOSIS — N5201 Erectile dysfunction due to arterial insufficiency: Secondary | ICD-10-CM | POA: Diagnosis not present

## 2023-09-24 DIAGNOSIS — F524 Premature ejaculation: Secondary | ICD-10-CM | POA: Diagnosis not present

## 2023-09-24 DIAGNOSIS — R350 Frequency of micturition: Secondary | ICD-10-CM | POA: Diagnosis not present

## 2023-09-25 ENCOUNTER — Other Ambulatory Visit: Payer: Self-pay | Admitting: Internal Medicine

## 2023-09-29 ENCOUNTER — Encounter: Payer: Self-pay | Admitting: Internal Medicine

## 2023-10-23 ENCOUNTER — Ambulatory Visit: Payer: Self-pay

## 2023-10-23 NOTE — Telephone Encounter (Signed)
 FYI Only or Action Required?: FYI only for provider.  Patient was last seen in primary care on 03/14/2023 by Amon Aloysius BRAVO, MD.  Called Nurse Triage reporting [place].  Symptoms began yesterday.  Interventions attempted: Nothing.  Symptoms are: gradually worsening.  Triage Disposition: See Physician Within 24 Hours  Patient/caregiver understands and will follow disposition?: Yes     Copied from CRM #[id number]. Topic: Clinical - Red Word Triage >> Oct 23, 2023  2:51 PM Robinson H wrote: [facility] that prompted transfer to Nurse Triage: Knot on right jaw below ear that's painful feels like inside of ear canal starting to feel tight and back of neck starting to hurt. Reason for Disposition  [1] Swelling is painful to touch AND [2] no fever  Answer Assessment - Initial Assessment Questions 1. APPEARANCE of SWELLING: What does it look like?     Right side of jaw below the ear has a huge bump 2. SIZE: How large is the swelling? (e.g., inches, cm; or compare to size of pinhead, tip of pen, eraser, coin, pea, grape, ping pong ball)      Bigger than a quarter 3. LOCATION: Where is the swelling located?     Right side of jaw 4. ONSET: When did the swelling start?     Last night 5. COLOR: What color is it? Is there more than one color?     Flesh colored 6. PAIN: Is there any pain? If Yes, ask: How bad is the pain? (Scale 1-10; or mild, moderate, severe)       Mild to moderate 7. ITCH: Does it itch? If Yes, ask: How bad is the itch?      denies 8. CAUSE: What do you think caused the swelling?     unknown 9 OTHER SYMPTOMS: Do you have any other symptoms? (e.g., fever)     Denies.  Protocols used: Skin Lump or Localized Swelling-A-AH

## 2023-10-24 ENCOUNTER — Other Ambulatory Visit: Payer: Self-pay

## 2023-10-24 ENCOUNTER — Encounter (HOSPITAL_COMMUNITY): Payer: Self-pay | Admitting: *Deleted

## 2023-10-24 ENCOUNTER — Ambulatory Visit (HOSPITAL_COMMUNITY)
Admission: EM | Admit: 2023-10-24 | Discharge: 2023-10-24 | Disposition: A | Discharge status: Home / Self Care | Attending: Emergency Medicine | Admitting: Emergency Medicine

## 2023-10-24 ENCOUNTER — Ambulatory Visit: Payer: Self-pay

## 2023-10-24 DIAGNOSIS — L03221 Cellulitis of neck: Secondary | ICD-10-CM | POA: Diagnosis not present

## 2023-10-24 DIAGNOSIS — K1121 Acute sialoadenitis: Secondary | ICD-10-CM | POA: Diagnosis not present

## 2023-10-24 MED ORDER — CEFTRIAXONE SODIUM 1 G IJ SOLR
INTRAMUSCULAR | Status: AC
  Filled 2023-10-24: qty 10

## 2023-10-24 MED ORDER — DOXYCYCLINE HYCLATE 100 MG PO CAPS: 100.0000 mg | ORAL_CAPSULE | Freq: Two times a day (BID) | ORAL | 0 refills | Status: AC

## 2023-10-24 MED ORDER — CEFTRIAXONE SODIUM 1 G IJ SOLR
1.0000 g | Freq: Once | INTRAMUSCULAR | Status: AC
  Administered 2023-10-24: 1 g via INTRAMUSCULAR

## 2023-10-24 MED ORDER — LIDOCAINE HCL (PF) 1 % IJ SOLN
INTRAMUSCULAR | Status: AC
  Filled 2023-10-24: qty 2

## 2023-10-24 NOTE — Telephone Encounter (Signed)
 Pt called and sent phone call to voicemail, left voicemal to advise pt to go to UC or ED due to no openings at office

## 2023-10-24 NOTE — Discharge Instructions (Signed)
 You received an injection of Rocephin  here in clinic today to cover for infection. Start taking doxycycline twice daily for 10 days for additional infection coverage. If you have increased swelling, spreading of redness, fever, body aches, chills, weakness, or feel like your throat is tightening and you are having difficulty swallowing or breathing please seek immediate medical treatment in the emergency department. Otherwise follow-up with your primary care provider or return here as needed.

## 2023-10-24 NOTE — Telephone Encounter (Signed)
 Reason for Disposition . Patient sounds very sick or weak to the triager  Answer Assessment - Initial Assessment Questions Swelling under right ear on jaw line Quarter or bigger sized lump Started yesterday Painful--getting worse per patient Tender to the touch Below jaw line---soreness in this  Patient states that his airway on the right feels tight Slight headache Back of Neck on right side pain Patient states that this is worse than it was yesterday   Patient just spoke with a [place] prior to speaking with this Triage RN who recommended the patient be seen ---Patient states that this [place] advised him of different possibilities of what could be going on and even advised the patient that airway compromise could be a potential complication   This RN advised the patient at this time that the recommendation is for him to go to the Emergency Room for further evaluation due to airway involvement and his symptoms especially since he states that they are worse since yesterday Patient states that he will go be seen at the Emergency Room He is also advised that at any point if anything gets worse he can call 911 for an ambulance Patient verbalized understanding    1. APPEARANCE of SWELLING: What does it look like?     Swelling/lump 2. SIZE: How large is the swelling? (e.g., inches, cm; or compare to size of pinhead, tip of pen, eraser, coin, pea, grape, ping pong ball)      Bigger than a quarter 3. LOCATION: Where is the swelling located?     Under right ear on the jawline and under the jawline 4. ONSET: When did the swelling start?     yesterday 5. COLOR: What color is it? Is there more than one color?     N/A 6. PAIN: Is there any pain? If Yes, ask: How bad is the pain? (Scale 1-10; or mild, moderate, severe)       Tender to the touch and feels tight 7. ITCH: Does it itch? If Yes, ask: How bad is the itch?      No 8. CAUSE: What do you think caused the  swelling?     Unknown 9 OTHER SYMPTOMS: Do you have any other symptoms? (e.g., fever)     Pain in back of neck and headache,  Protocols used: Skin Lump or Localized Swelling-A-AH

## 2023-10-24 NOTE — Telephone Encounter (Addendum)
     FYI Only or Action Required?: FYI only for provider.  Patient was last seen in primary care on 03/14/2023 by Amon Aloysius BRAVO, MD.  Called Nurse Triage reporting Facial Swelling.  Symptoms began yesterday.  Interventions attempted: Nothing.  Symptoms are: gradually worsening.  Triage Disposition: Go to ED Now (or PCP Triage)  Patient/caregiver understands and will follow disposition?: Yes         Copied from CRM [id number]. Topic: Clinical - Red Word Triage >> Oct 24, 2023  2:42 PM [place] E wrote: [facility] that prompted transfer to Nurse Triage: Knot on right side of jaw below ear, pain getting worse since yesterday. Back of neck still has some pain, starting to have a headache.

## 2023-10-24 NOTE — ED Triage Notes (Signed)
 PT reports rt facial swelling started yesterday. Pt reports he feels a lump just below Rt jaw line. PT took benadryl at home today.

## 2023-10-24 NOTE — ED Provider Notes (Signed)
 [facility]    CSN: [id number] Arrival date & time: 10/24/23  1600      History   Chief Complaint Chief Complaint  Patient presents with   Facial Swelling    HPI Michael Berg is a 58 y.o. male.   Patient presents with right-sided facial and neck swelling that began yesterday.  Patient states that he felt a lump along his right lower jaw line as well.  Denies fever, body aches, chills.  Denies sore throat, cough, and congestion.  Denies difficulty breathing or swallowing.  Denies sensation of throat swelling or tightness.  Patient states that he did take Benadryl at home without relief.  Patient states that he did call his doctor and they recommended that he be evaluated in person for further evaluation and management of this due to concern for airway compromise.  The history is provided by the patient and medical records.    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 02/20/2016   Leg cramps 02/20/2016   Annual physical exam 01/11/2011   Dyslipidemia 06/19/2006   ANXIETY DEPRESSION 06/19/2006   Insomnia 06/19/2006    Past Surgical History:  Procedure Laterality Date   APPENDECTOMY  1982   GOLD SEED IMPLANT N/A 07/20/2021   Procedure: GOLD SEED IMPLANT;  Surgeon: Cam Morene ORN, MD;  Location: [facility];  Service: Urology;  Laterality: N/A;  30 MINS   SPACE OAR INSTILLATION N/A 07/20/2021   Procedure: SPACE OAR INSTILLATION;  Surgeon: Cam Morene ORN, MD;  Location: [facility];  Service: Urology;  Laterality: N/A;       Home Medications    Prior to  Admission medications   Medication Sig Start Date End Date Taking? Authorizing Provider  atorvastatin  (LIPITOR) 80 MG tablet Take 1 tablet (80 mg total) by mouth daily. 07/15/23  Yes Paz, Jose E, MD  cetirizine (ZYRTEC) 10 MG tablet Take 10 mg by mouth daily as needed for allergies.   Yes [provider]  doxycycline (VIBRAMYCIN) 100 MG capsule Take 1 capsule (100 mg total) by mouth 2 (two) times daily. 10/24/23  Yes [name], NP  Multiple Vitamin (MULTIVITAMIN) tablet Take 1 tablet by mouth daily.     Yes [provider]  traZODone  (DESYREL ) 50 MG tablet Take 0.5-1 tablets (25-50 mg total) by mouth at bedtime as needed for sleep. 09/25/23  Yes Paz, Jose E, MD  fluticasone  (FLONASE ) 50 MCG/ACT nasal spray Place 2 sprays into both nostrils daily. Patient taking differently: Place 2 sprays into both nostrils daily as needed. 06/02/19   Michael Cyndee Jamee JONELLE, DO    Family History Family History  Problem Relation Age of Onset   Coronary artery disease Father        CABG at age 5   Colon cancer Other        uncle dx at age 20 (?)   Diabetes Paternal Grandmother    Heart defect Brother        no CAD, open heart surgery as an adult  Prostate cancer Neg Hx     Social History Social History   Tobacco Use   Smoking status: Former    Current packs/day: 0.00    Types: Cigarettes    Start date: 48    Quit date: 1995    Years since quitting: 30.6   Smokeless tobacco: Never  Vaping Use   Vaping status: Never Used  Substance Use Topics   Alcohol use: Yes    Comment: occasional   Drug use: Not Currently    Types: Marijuana    Comment: weed hardly ever     Allergies   Augmentin  [amoxicillin -pot clavulanate]   Review of Systems Review of Systems  Per HPI  Physical Exam Triage Vital Signs ED Triage Vitals  Encounter Vitals Group     BP 10/24/23 1629 116/79     Girls Systolic BP Percentile --      Girls Diastolic BP Percentile --      Boys Systolic  BP Percentile --      Boys Diastolic BP Percentile --      Pulse Rate 10/24/23 1629 71     Resp 10/24/23 1629 18     Temp 10/24/23 1629 98.1 F (36.7 C)     Temp src --      SpO2 10/24/23 1629 98 %     Weight --      Height --      Head Circumference --      Peak Flow --      Pain Score 10/24/23 1626 5     Pain Loc --      Pain Education --      Exclude from Growth Chart --    No data found.  Updated Vital Signs BP 116/79   Pulse 71   Temp 98.1 F (36.7 C)   Resp 18   SpO2 98%   Visual Acuity Right Eye Distance:   Left Eye Distance:   Bilateral Distance:    Right Eye Near:   Left Eye Near:    Bilateral Near:     Physical Exam Vitals and nursing note reviewed.  Constitutional:      General: He is awake. He is not in acute distress.    Appearance: Normal appearance. He is well-developed and well-groomed. He is not ill-appearing.  HENT:     Right Ear: Tympanic membrane, ear canal and external ear normal.     Left Ear: Tympanic membrane, ear canal and external ear normal.     Nose: Nose normal.     Mouth/Throat:     Mouth: Mucous membranes are moist.     Pharynx: Oropharynx is clear. No pharyngeal swelling, oropharyngeal exudate, posterior oropharyngeal erythema, uvula swelling or postnasal drip.     Tonsils: No tonsillar abscesses.  Neck:     Comments: Swelling, tenderness, and erythema noted over the right parotid gland.  Right sided superficial cervical adenopathy noted. Cardiovascular:     Rate and Rhythm: Normal rate and regular rhythm.  Pulmonary:     Effort: Pulmonary effort is normal.     Breath sounds: Normal breath sounds.  Musculoskeletal:     Cervical back: Edema and erythema present.  Lymphadenopathy:     Cervical: Cervical adenopathy present.     Right cervical: Superficial cervical adenopathy present.  Skin:    General: Skin is warm and dry.  Neurological:     Mental Status: He is alert.  Psychiatric:        Behavior: Behavior is  cooperative.  UC Treatments / Results  Labs (all labs ordered are listed, but only abnormal results are displayed) Labs Reviewed - No data to display  EKG   Radiology No results found.  Procedures Procedures (including critical care time)  Medications Ordered in UC Medications  cefTRIAXone  (ROCEPHIN ) injection 1 g (1 g Intramuscular Given 10/24/23 1648)    Initial Impression / Assessment and Plan / UC Course  I have reviewed the triage vital signs and the nursing notes.  Pertinent labs & imaging results that were available during my care of the patient were reviewed by me and considered in my medical decision making (see chart for details).     Patient is overall well-appearing.  No signs of acute distress.  Vitals are stable.  Exam findings consistent with parotitis.  Suspicion for an bacterial etiology or cellulitis due to presence of erythema.  Given IM Rocephin  in clinic.  Prescribed doxycycline for additional cellulitis coverage.  Discussed follow-up, return, and strict ER precautions. Final Clinical Impressions(s) / UC Diagnoses   Final diagnoses:  Acute parotitis  Cellulitis of neck     Discharge Instructions      You received an injection of Rocephin  here in clinic today to cover for infection. Start taking doxycycline twice daily for 10 days for additional infection coverage. If you have increased swelling, spreading of redness, fever, body aches, chills, weakness, or feel like your throat is tightening and you are having difficulty swallowing or breathing please seek immediate medical treatment in the emergency department. Otherwise follow-up with your primary care provider or return here as needed.    ED Prescriptions     Medication Sig Dispense Auth. Provider   doxycycline (VIBRAMYCIN) 100 MG capsule Take 1 capsule (100 mg total) by mouth 2 (two) times daily. 20 capsule [name], NP      PDMP not reviewed this encounter.   [name], NP 10/24/23 [phone]

## 2023-12-03 IMAGING — MR MR PROSTATE WO/W CM
14 series · 48 of 48 positions shown · IV contrast (20 ml multihance)
Comparison: None.

CLINICAL DATA: Prostate carcinoma, Gleason score 6. Active
surveillance.

EXAM:
MR PROSTATE WITHOUT AND WITH CONTRAST
TECHNIQUE: Multiplanar multisequence MRI images were obtained of the pelvis
centered about the prostate. Pre and post contrast images were
obtained.
CONTRAST:  20mL MULTIHANCE GADOBENATE DIMEGLUMINE 529 MG/ML IV SOLN

[Series 3: T2 · coronal · 3.0mm · 0.56mm/px · 1 of 23 slices shown (1 of 3)]
[im 1/23]
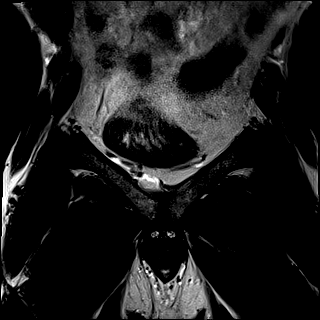

[Series 4: T1 · axial · 5.0mm · 1.25mm/px · 1 of 80 slices shown]
[im 1/80]
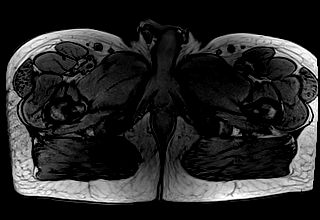

[Series 5: DWI · axial · 3.0mm · 1.75mm/px · 1 of 75 slices shown (1 of 3)]
[im 1/75]
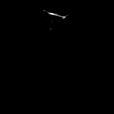

[Series 6: DWI · axial · 3.0mm · 1.75mm/px · 1 of 25 slices shown (2 of 3)]
[im 1/25]
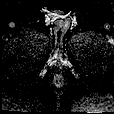

[Series 7: DWI · axial · 3.0mm · 1.75mm/px · 1 of 25 slices shown (3 of 3)]
[im 1/25]
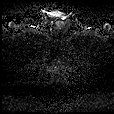

[Series 8: T2 · axial · 3.0mm · 0.56mm/px · 1 of 25 slices shown (2 of 3)]
[im 1/25]
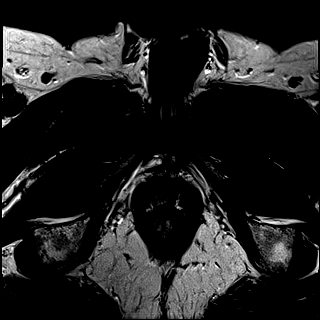

[Series 9: T2 · axial · 1.0mm · 1.04mm/px · z∈[-79,-8]mm · 2 of 72 slices shown (3 of 3)]
[im 1/72]
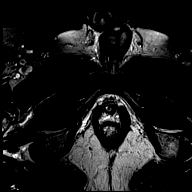
[im 72/72]
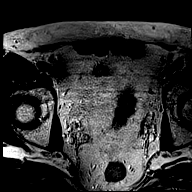

[Series 10: pre t1_twist_tra_dyn · axial · non-contrast · 3.5mm · 0.83mm/px · 1 of 22 slices shown]
[im 1/22]
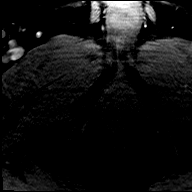

[Series 11: post t1_twist_tra_dyn-copy center · axial · non-contrast · 3.5mm · 0.83mm/px · z∈[-77,-11]mm · 2 of 60 slices shown (1 of 2)]
[im 1/60]
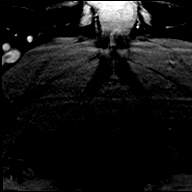
[im 60/60]
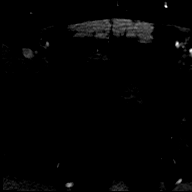

[Series 12: post t1_twist_tra_dyn-copy cent_sub · axial · 3.5mm · 0.83mm/px · 1 of 40 slices shown (1 of 2)]
[im 1/40]
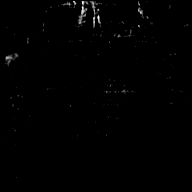

[Series 13: post t1_twist_tra_dyn-copy center · axial · non-contrast · 3.5mm · 0.83mm/px · z∈[-77,-11]mm · 16 of 600 slices shown (2 of 2)]
[im 1/600]
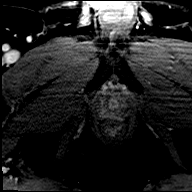
[im 40/600]
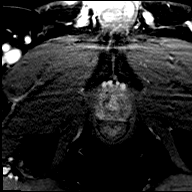
[im 80/600]
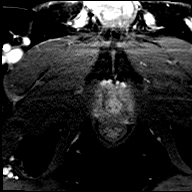
[im 120/600]
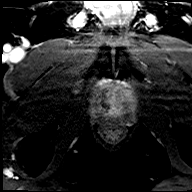
[im 160/600]
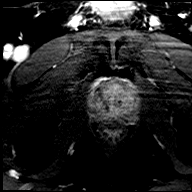
[im 200/600]
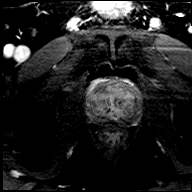
[im 240/600]
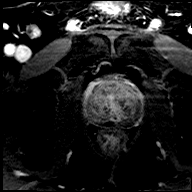
[im 280/600]
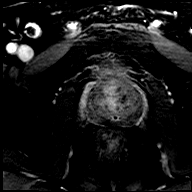
[im 320/600]
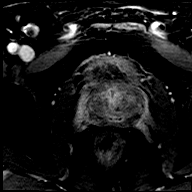
[im 360/600]
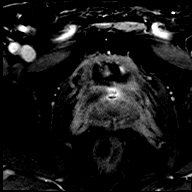
[im 400/600]
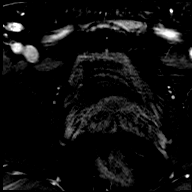
[im 440/600]
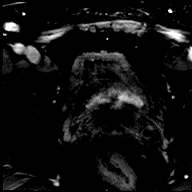
[im 480/600]
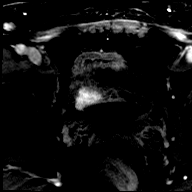
[im 520/600]
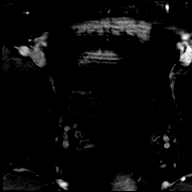
[im 560/600]
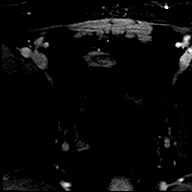
[im 600/600]
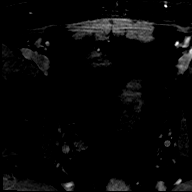

[Series 14: post t1_twist_tra_dyn-copy cent_sub · axial · 3.5mm · 0.83mm/px · z∈[-77,-11]mm · 16 of 580 slices shown (2 of 2)]
[im 1/580]
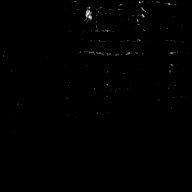
[im 39/580]
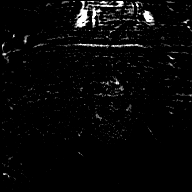
[im 78/580]
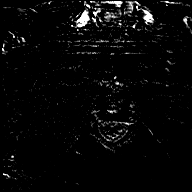
[im 116/580]
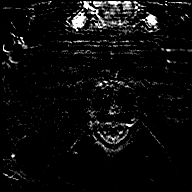
[im 155/580]
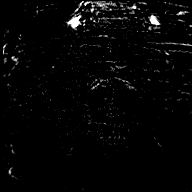
[im 194/580]
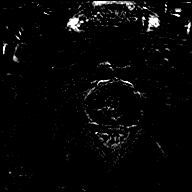
[im 232/580]
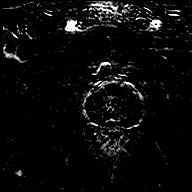
[im 271/580]
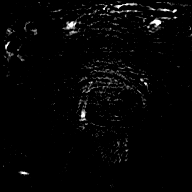
[im 309/580]
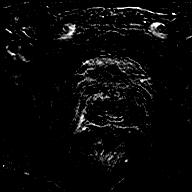
[im 348/580]
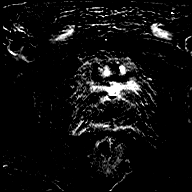
[im 387/580]
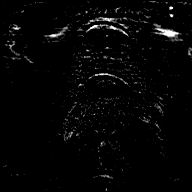
[im 425/580]
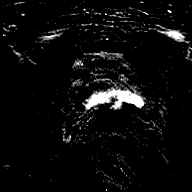
[im 464/580]
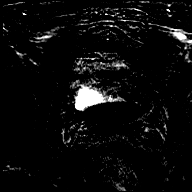
[im 502/580]
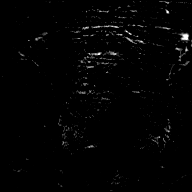
[im 541/580]
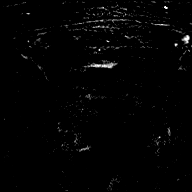
[im 580/580]
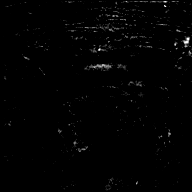

[Series 15: t1_vibe_dixon_tra_f · axial · 2.5mm · 0.91mm/px · z∈[-87,+111]mm · 2 of 80 slices shown]
[im 1/80]
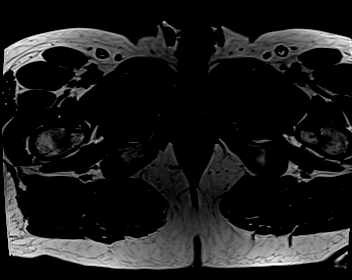
[im 80/80]
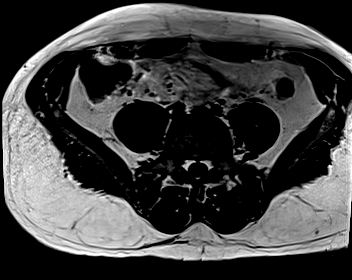

[Series 16: t1_vibe_dixon_tra_w · axial · 2.5mm · 0.91mm/px · z∈[-87,+111]mm · 2 of 80 slices shown]
[im 1/80]
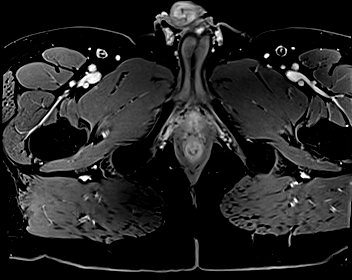
[im 80/80]
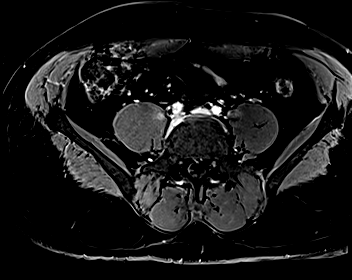

[48 of 48 positions shown; findings below may reference images not displayed]

FINDINGS: Prostate:

-- Peripheral Zone: A poorly defined T2 hypointense nodule is seen
in the left posteromedial apex measuring 7 x 7 mm on image [DATE].
This nodule shows marked ADC hypointensity and DWI hyperintensity,
but no early focal contrast enhancement. PI-RADS 4

An ill-defined T2 hypointense nodule is seen in the right posterior
apex which measures 9 x 6 mm on image [DATE]. This nodule shows marked
ADC hypointensity, but no significant DWI hyperintensity or early
focal contrast enhancement. PI-RADS 3

-- Transition/Central Zone: Mildly enlarged with small encapsulated
BPH nodules. No suspicious nodules identified on T2-weighted or
diffusion sequences.

-- Measurements/Volume:  4.9 x 3.9 x 4.9 cm (volume = 49 cm^3)

Transcapsular spread:  Absent

Seminal vesicle involvement:  Absent

Neurovascular bundle involvement:  Absent

Pelvic adenopathy: None visualized

Bone metastasis: None visualized

Other:  Sigmoid diverticulosis, without evidence of diverticulitis.
IMPRESSION: 7 mm peripheral zone nodule in the left posteromedial apex,
suspicious for high-grade carcinoma. PI-RADS 4 (v2.1): High
(clinically significant cancer likely)

9 mm peripheral zone nodule in the right posterior apex, which is
indeterminate. PI-RADS 3 (v2.1): Intermediate (clinically
significant cancer equivocal)

No evidence of extracapsular extension or pelvic metastatic disease.

(I have post-processed this exam in the [employer] application for
potential fusion-guided biopsy.)

## 2024-01-01 DIAGNOSIS — M9902 Segmental and somatic dysfunction of thoracic region: Secondary | ICD-10-CM | POA: Diagnosis not present

## 2024-01-01 DIAGNOSIS — M9901 Segmental and somatic dysfunction of cervical region: Secondary | ICD-10-CM | POA: Diagnosis not present

## 2024-01-01 DIAGNOSIS — M9903 Segmental and somatic dysfunction of lumbar region: Secondary | ICD-10-CM | POA: Diagnosis not present

## 2024-01-01 DIAGNOSIS — M9905 Segmental and somatic dysfunction of pelvic region: Secondary | ICD-10-CM | POA: Diagnosis not present

## 2024-01-14 DIAGNOSIS — M9902 Segmental and somatic dysfunction of thoracic region: Secondary | ICD-10-CM | POA: Diagnosis not present

## 2024-01-14 DIAGNOSIS — M9901 Segmental and somatic dysfunction of cervical region: Secondary | ICD-10-CM | POA: Diagnosis not present

## 2024-01-14 DIAGNOSIS — M9903 Segmental and somatic dysfunction of lumbar region: Secondary | ICD-10-CM | POA: Diagnosis not present

## 2024-01-14 DIAGNOSIS — M9905 Segmental and somatic dysfunction of pelvic region: Secondary | ICD-10-CM | POA: Diagnosis not present

## 2024-01-21 DIAGNOSIS — M9903 Segmental and somatic dysfunction of lumbar region: Secondary | ICD-10-CM | POA: Diagnosis not present

## 2024-01-21 DIAGNOSIS — M9905 Segmental and somatic dysfunction of pelvic region: Secondary | ICD-10-CM | POA: Diagnosis not present

## 2024-01-21 DIAGNOSIS — M9902 Segmental and somatic dysfunction of thoracic region: Secondary | ICD-10-CM | POA: Diagnosis not present

## 2024-01-21 DIAGNOSIS — M9901 Segmental and somatic dysfunction of cervical region: Secondary | ICD-10-CM | POA: Diagnosis not present

## 2024-01-23 DIAGNOSIS — C61 Malignant neoplasm of prostate: Secondary | ICD-10-CM | POA: Diagnosis not present

## 2024-01-24 LAB — PSA: PSA: 0.6

## 2024-01-30 DIAGNOSIS — N5203 Combined arterial insufficiency and corporo-venous occlusive erectile dysfunction: Secondary | ICD-10-CM | POA: Diagnosis not present

## 2024-01-30 DIAGNOSIS — F524 Premature ejaculation: Secondary | ICD-10-CM | POA: Diagnosis not present

## 2024-01-30 DIAGNOSIS — C61 Malignant neoplasm of prostate: Secondary | ICD-10-CM | POA: Diagnosis not present

## 2024-02-26 ENCOUNTER — Other Ambulatory Visit: Payer: Self-pay | Admitting: Internal Medicine

## 2024-02-26 MED ORDER — TRAZODONE HCL 50 MG PO TABS: 25.0000 mg | ORAL_TABLET | Freq: Every evening | ORAL | 1 refills | Status: DC | PRN

## 2024-02-26 NOTE — Telephone Encounter (Unsigned)
 Copied from CRM #[id number]. Topic: Clinical - Medication Refill >> Feb 26, 2024 12:06 PM Aisha D wrote: Medication: traZODone  (DESYREL ) 50 MG tablet  Has the patient contacted their pharmacy? No (Agent: If no, request that the patient contact the pharmacy for the refill. If patient does not wish to contact the pharmacy document the reason why and proceed with request.) (Agent: If yes, when and what did the pharmacy advise?)  This is the patient's preferred pharmacy:  [facility] # [address] [place] - [address] [place] [address] Phone: [phone] Fax: [phone]   Is this the correct pharmacy for this prescription? Yes If no, delete pharmacy and type the correct one.   Has the prescription been filled recently? No  Is the patient out of the medication? No  Has the patient been seen for an appointment in the last year OR does the patient have an upcoming appointment? Yes  Can we respond through MyChart? Yes  Agent: Please be advised that Rx refills may take up to 3 business days. We ask that you follow-up with your pharmacy.

## 2024-03-05 ENCOUNTER — Telehealth: Payer: Self-pay

## 2024-03-05 NOTE — Telephone Encounter (Signed)
 Copied from CRM #[id number]. Topic: Clinical - Prescription Issue >> Mar 05, 2024 10:47 AM Rea BROCKS wrote: Reason for CRM: Patient called in and stated that he hasn't received his medication from [facility] for traZODone  (DESYREL ) 50 MG tablet. Medication refill did state that prescription needed to be sent to [facility] but the refill was still sent to [facility] for refill. Patient stated that he will check in with [facility] first to see if they have the medication ready for him, and if not he would call us  back. But, for future medications he would like for all to be filled at [facility].   [facility] # [address] [place] - [address] [place] [address] Phone: [phone] Fax: [phone]

## 2024-03-05 NOTE — Telephone Encounter (Signed)
 Noted

## 2024-03-15 ENCOUNTER — Encounter: Payer: Self-pay | Admitting: Internal Medicine

## 2024-03-19 ENCOUNTER — Encounter: Payer: BC Managed Care – PPO | Admitting: Internal Medicine

## 2024-03-23 ENCOUNTER — Other Ambulatory Visit: Payer: Self-pay

## 2024-03-23 MED ORDER — TRAZODONE HCL 50 MG PO TABS: 25.0000 mg | ORAL_TABLET | Freq: Every evening | ORAL | 1 refills | Status: AC | PRN

## 2024-04-15 ENCOUNTER — Other Ambulatory Visit: Payer: Self-pay | Admitting: Internal Medicine

## 2024-08-06 ENCOUNTER — Encounter: Admitting: Internal Medicine
